# Patient Record
Sex: Male | Born: 1965 | Race: White | Hispanic: No | Marital: Married | Smoking: Never smoker
Health system: Southern US, Community
[De-identification: ages and names within clinical notes are randomized; demographics above are authoritative.]

## PROBLEM LIST (undated history)

## (undated) DIAGNOSIS — K219 Gastro-esophageal reflux disease without esophagitis: Secondary | ICD-10-CM

## (undated) DIAGNOSIS — R519 Headache, unspecified: Secondary | ICD-10-CM

## (undated) DIAGNOSIS — N390 Urinary tract infection, site not specified: Secondary | ICD-10-CM

## (undated) DIAGNOSIS — M5412 Radiculopathy, cervical region: Secondary | ICD-10-CM

## (undated) DIAGNOSIS — J309 Allergic rhinitis, unspecified: Secondary | ICD-10-CM

## (undated) DIAGNOSIS — R51 Headache: Secondary | ICD-10-CM

## (undated) DIAGNOSIS — G4730 Sleep apnea, unspecified: Secondary | ICD-10-CM

## (undated) DIAGNOSIS — F411 Generalized anxiety disorder: Secondary | ICD-10-CM

## (undated) DIAGNOSIS — R569 Unspecified convulsions: Secondary | ICD-10-CM

## (undated) DIAGNOSIS — K921 Melena: Secondary | ICD-10-CM

## (undated) DIAGNOSIS — S8392XA Sprain of unspecified site of left knee, initial encounter: Secondary | ICD-10-CM

## (undated) DIAGNOSIS — B019 Varicella without complication: Secondary | ICD-10-CM

## (undated) DIAGNOSIS — K649 Unspecified hemorrhoids: Secondary | ICD-10-CM

## (undated) DIAGNOSIS — I209 Angina pectoris, unspecified: Secondary | ICD-10-CM

## (undated) DIAGNOSIS — I10 Essential (primary) hypertension: Secondary | ICD-10-CM

## (undated) HISTORY — DX: Varicella without complication: B01.9

## (undated) HISTORY — DX: Unspecified hemorrhoids: K64.9

## (undated) HISTORY — DX: Essential (primary) hypertension: I10

## (undated) HISTORY — PX: RECONSTRUCTION OF NOSE: SHX2301

## (undated) HISTORY — DX: Melena: K92.1

## (undated) HISTORY — DX: Sprain of unspecified site of left knee, initial encounter: S83.92XA

## (undated) HISTORY — DX: Headache: R51

## (undated) HISTORY — DX: Urinary tract infection, site not specified: N39.0

## (undated) HISTORY — DX: Headache, unspecified: R51.9

## (undated) HISTORY — DX: Allergic rhinitis, unspecified: J30.9

## (undated) HISTORY — DX: Unspecified convulsions: R56.9

## (undated) HISTORY — DX: Gastro-esophageal reflux disease without esophagitis: K21.9

## (undated) HISTORY — PX: OTHER SURGICAL HISTORY: SHX169

---

## 1996-10-17 DIAGNOSIS — R569 Unspecified convulsions: Secondary | ICD-10-CM

## 1996-10-17 HISTORY — DX: Unspecified convulsions: R56.9

## 1998-10-06 ENCOUNTER — Ambulatory Visit (HOSPITAL_BASED_OUTPATIENT_CLINIC_OR_DEPARTMENT_OTHER): Admission: RE | Admit: 1998-10-06 | Discharge: 1998-10-06 | Payer: Self-pay | Admitting: Orthopaedic Surgery

## 1998-10-17 HISTORY — PX: SHOULDER SURGERY: SHX246

## 2009-04-17 ENCOUNTER — Emergency Department (HOSPITAL_COMMUNITY): Admission: EM | Admit: 2009-04-17 | Discharge: 2009-04-17 | Payer: Self-pay | Admitting: Family Medicine

## 2011-01-24 LAB — GC/CHLAMYDIA PROBE AMP, GENITAL
Chlamydia, DNA Probe: NEGATIVE
GC Probe Amp, Genital: NEGATIVE

## 2011-01-24 LAB — POCT URINALYSIS DIP (DEVICE)
Glucose, UA: NEGATIVE mg/dL
Hgb urine dipstick: NEGATIVE
Ketones, ur: NEGATIVE mg/dL
Specific Gravity, Urine: 1.02 (ref 1.005–1.030)
Urobilinogen, UA: 0.2 mg/dL (ref 0.0–1.0)

## 2012-03-15 ENCOUNTER — Encounter (HOSPITAL_COMMUNITY): Payer: Self-pay | Admitting: *Deleted

## 2012-03-15 ENCOUNTER — Emergency Department (INDEPENDENT_AMBULATORY_CARE_PROVIDER_SITE_OTHER)
Admission: EM | Admit: 2012-03-15 | Discharge: 2012-03-15 | Disposition: A | Discharge status: Home / Self Care | Payer: PRIVATE HEALTH INSURANCE | Source: Home / Self Care | Attending: Emergency Medicine | Admitting: Emergency Medicine

## 2012-03-15 DIAGNOSIS — R42 Dizziness and giddiness: Secondary | ICD-10-CM

## 2012-03-15 LAB — POCT I-STAT, CHEM 8
BUN: 16 mg/dL (ref 6–23)
Calcium, Ion: 1.21 mmol/L (ref 1.12–1.32)
Chloride: 101 mEq/L (ref 96–112)
HCT: 50 % (ref 39.0–52.0)
Potassium: 4.1 mEq/L (ref 3.5–5.1)

## 2012-03-15 NOTE — ED Notes (Signed)
Pt  Reports  Symptoms  Of  dizzyness    Which  began around 1100  Am      He   denys  Any pain  denys  Any  Nausea /  Vomiting      -  He  Ambulated  To  Exam room  With a  Slow  Steady  Fluid  Gait     -  He  denys  Any   Injury  Or any  Known  Causative  Agent       He  Is  Sitting upright  On  Exam table  Speaking in  Complete  sentances

## 2012-03-15 NOTE — Discharge Instructions (Signed)
Suspect your  dizziness and headaches might be related to the medicine you're taking have encouraged you and recommended you to talk to your neurologist about this. Have also discussed what symptoms would warrant further evaluation in the emergency department.    Dizziness Dizziness is a common problem. It is a feeling of unsteadiness or lightheadedness. You may feel like you are about to faint. Dizziness can lead to injury if you stumble or fall. A person of any age group can suffer from dizziness, but dizziness is more common in older adults. CAUSES  Dizziness can be caused by many different things, including:  Middle ear problems.   Standing for too long.   Infections.   An allergic reaction.   Aging.   An emotional response to something, such as the sight of blood.   Side effects of medicines.   Fatigue.   Problems with circulation or blood pressure.   Excess use of alcohol, medicines, or illegal drug use.   Breathing too fast (hyperventilation).   An arrhythmia or problems with your heart rhythm.   Low red blood cell count (anemia).   Pregnancy.   Vomiting, diarrhea, fever, or other illnesses that cause dehydration.   Diseases or conditions such as Parkinson's disease, high blood pressure (hypertension), diabetes, and thyroid problems.   Exposure to extreme heat.  DIAGNOSIS  To find the cause of your dizziness, your caregiver may do a physical exam, lab tests, radiologic imaging scans, or an electrocardiography test (ECG).  TREATMENT  Treatment of dizziness depends on the cause of your symptoms and can vary greatly. HOME CARE INSTRUCTIONS   Drink enough fluids to keep your urine clear or pale yellow. This is especially important in very hot weather. In the elderly, it is also important in cold weather.   If your dizziness is caused by medicines, take them exactly as directed. When taking blood pressure medicines, it is especially important to get up slowly.    Rise slowly from chairs and steady yourself until you feel okay.   In the morning, first sit up on the side of the bed. When this seems okay, stand slowly while holding onto something until you know your balance is fine.   If you need to stand in one place for a long time, be sure to move your legs often. Tighten and relax the muscles in your legs while standing.   If dizziness continues to be a problem, have someone stay with you for a day or two. Do this until you feel you are well enough to stay alone. Have the person call your caregiver if he or she notices changes in you that are concerning.   Do not drive or use heavy machinery if you feel dizzy.  SEEK IMMEDIATE MEDICAL CARE IF:   Your dizziness or lightheadedness gets worse.   You feel nauseous or vomit.   You develop problems with talking, walking, weakness, or using your arms, hands, or legs.   You are not thinking clearly or you have difficulty forming sentences. It may take a friend or family member to determine if your thinking is normal.   You develop chest pain, abdominal pain, shortness of breath, or sweating.   Your vision changes.   You notice any bleeding.   You have side effects from medicine that seems to be getting worse rather than better.  MAKE SURE YOU:   Understand these instructions.   Will watch your condition.   Will get help right away if you  are not doing well or get worse.  Document Released: 03/29/2001 Document Revised: 09/22/2011 Document Reviewed: 04/22/2011 [facility] Patient Information 2012 [place], [place].

## 2012-03-15 NOTE — ED Provider Notes (Signed)
History     CSN: [id number]  Arrival date & time 03/15/12  1609   First MD Initiated Contact with Patient 03/15/12 1614      Chief Complaint  Patient presents with  . Dizziness    (Consider location/radiation/quality/duration/timing/severity/associated sxs/prior treatment) HPI Comments: Patient presents urgent care today with some vague symptomatology that describes as dizziness and his head. Patient described that he hasn't felt symptoms like this before but have faded away and has been told by another doctor that he should drink at least 2-3 day or as per day when he gets to feel like this. In from some abnormal labs that he has had in the past. (Patient does not elaborate further on the details of this abnormal labs). Patient denies any specific symptoms such as, numbness, weakness visual disturbances.   She denies any shortness of breath, shortness of breath palpitations or any further symptoms.    The history is provided by the patient.    History reviewed. No pertinent past medical history.  Past Surgical History  Procedure Date  . Shoulder surgery   . Seizures     No family history on file.  History  Substance Use Topics  . Smoking status: Not on file  . Smokeless tobacco: Not on file  . Alcohol Use:       Review of Systems  Constitutional: Negative for fever, chills, activity change and appetite change.  Musculoskeletal: Negative for myalgias and arthralgias.  Neurological: Positive for dizziness and headaches. Negative for tremors, seizures, syncope, speech difficulty, weakness, light-headedness and numbness.  Psychiatric/Behavioral: Positive for decreased concentration. Negative for suicidal ideas, behavioral problems, confusion, sleep disturbance, self-injury and agitation. The patient is not nervous/anxious and is not hyperactive.     Allergies  Review of patient's allergies indicates not on file.  Home Medications   Current Outpatient Rx  Name  Route Sig Dispense Refill  . OXCARBAZEPINE 300 MG PO TABS Oral Take 300 mg by mouth 2 (two) times daily.      BP 133/74  Pulse 64  Temp(Src) 98.3 F (36.8 C) (Oral)  Resp 16  SpO2 100%  Physical Exam  Nursing note and vitals reviewed. Constitutional: He is oriented to person, place, and time. He appears well-developed and well-nourished.  Eyes: Conjunctivae are normal. Pupils are equal, round, and reactive to light.  Neck: Neck supple. No JVD present.  Cardiovascular: Normal rate, normal heart sounds and intact distal pulses.  Exam reveals no gallop and no friction rub.   No murmur heard. Pulmonary/Chest: Effort normal and breath sounds normal.  Musculoskeletal: Normal range of motion. He exhibits no edema and no tenderness.  Lymphadenopathy:    He has no cervical adenopathy.  Neurological: He is oriented to person, place, and time. He has normal strength. He is not disoriented. He displays no tremor. No cranial nerve deficit or sensory deficit. He exhibits normal muscle tone. He displays a negative Romberg sign. Coordination and gait normal.  Skin: Skin is warm.    ED Course  Procedures (including critical care time)   Labs Reviewed  POCT I-STAT, CHEM 8   No results found.   1. Dizziness       MDM  Discussed with patient that most likely his symptomatology could be related to his Trileptal as dizziness and headaches are common side effects. I have apprised him to discuss this further with his neurologist. Patient agree with treatment plan and understood that his exam was normal and we discuss fogginess or dizziness sensation  this appears patient of knowledge normal lab results today. Patient's cardiovascular neurological exam were normal. He was also advised to be driven by a family member or friend, patient understood and agreed with plan      Jimmie Molly, MD 03/15/12 2046

## 2012-03-15 NOTE — ED Notes (Signed)
bp  116/70   Pulse  65    Flat         bp  133/84  Pulse  64  Sitting      bp   138/90  Pulse  78   standing

## 2013-07-02 ENCOUNTER — Emergency Department (HOSPITAL_COMMUNITY)
Admission: EM | Admit: 2013-07-02 | Discharge: 2013-07-02 | Disposition: A | Discharge status: Home / Self Care | Payer: Managed Care, Other (non HMO) | Source: Home / Self Care | Attending: Emergency Medicine | Admitting: Emergency Medicine

## 2013-07-02 ENCOUNTER — Encounter (HOSPITAL_COMMUNITY): Payer: Self-pay | Admitting: *Deleted

## 2013-07-02 DIAGNOSIS — K645 Perianal venous thrombosis: Secondary | ICD-10-CM

## 2013-07-02 NOTE — ED Notes (Signed)
Pt  Reports  Bright   Red  Rectal  Bleeding  Which  He  Reports  He  Noticed        This  Am     He  Reports  He  Noticed  A  Bulge  In his  Rectal  Area  sev  Days  Ago   He  denys  Any  Constipation     He  denys  Any pain  No  History of  gerd              He        Reports  Some  Nausea  No      But  No  Vomiting

## 2013-07-02 NOTE — ED Provider Notes (Signed)
Chief Complaint:   Chief Complaint  Patient presents with  . Rectal Bleeding    History of Present Illness:    Benjamin Andrews is a 47 year old male who noted a sore bulge just to the left of the anus 4 days ago. Today it began to bleed and he's had trouble getting it to stop. The blood is bright red. It is somewhat sore to touch. He's felt nauseated and had some sweats. He denies abdominal pain, vomiting, diarrhea, or constipation.  Review of Systems:  Other than noted above, the patient denies any of the following symptoms: Constitutional:  No fever, chills, fatigue, weight loss or anorexia. Lungs:  No cough or shortness of breath. Heart:  No chest pain, palpitations, syncope or edema.  No cardiac history. Abdomen:  No nausea, vomiting, hematememesis, melena, constipation, or diarrhea. GU:  No dysuria, frequency, urgency, or hematuria.   Skin:  No rash or itching.  PMFSH:  Past medical history, family history, social history, meds, and allergies were reviewed along with nurse's notes.  No prior abdominal surgeries or history of GI problems.  No use of NSAIDs or aspirin.  No excessive  alcohol intake.   Physical Exam:   Vital signs:  BP 149/84  Pulse 63  Temp(Src) 98.3 F (36.8 C) (Oral)  Resp 16  SpO2 99% Gen:  Alert, oriented, in no distress. Lungs:  Breath sounds clear and equal bilaterally.  No wheezes, rales or rhonchi. Heart:  Regular rhythm.  No gallops or murmers.   Abdomen:  Soft, nontender, no organomegaly or mass, bowel sounds were normal. Skin:  Clear, warm and dry.  No rash.  Procedure Note:  Verbal informed consent was obtained from the patient.  Risks and benefits were outlined with the patient.  Patient understands and accepts these risks.  Identity of the patient was confirmed verbally and by armband.    Procedure was performed as followed:  External exam of the anus reveals a thrombosed external hemorrhoid which had partially opened up and was bleeding profusely. The  clot was evacuated, pressure was used first to stop the bleeding, but was unsuccessful. Thereafter the hemorrhoid was anesthetized with 2 mL of 2% Xylocaine with epinephrine. The small opening was cauterized with silver nitrate, but still the bleeding persisted. Finally the hemorrhoid was closed with a figure 8 3-0 catgut suture. Thereafter the bleeding stopped completely. Some gauze was left in place. He was instructed to do daily sitz baths with Epsom salts, apply operation age, and take a stool softener. If the bleeding persists he will need to see a surgeon and he was given the name of Dr. Avel Peace. In any event, he will need a colonoscopy and was given the name of Dr. Yancey Flemings.  Patient tolerated the procedure well without any immediate complications.  Assessment:  The encounter diagnosis was Thrombosed external hemorrhoid.  Thrombosed external hemorrhoid with bleeding. Only followup with gastroenterology. If bleeding or pain persists and need to have it excised by surgeon.  Plan:   1.  The following meds were prescribed:   Discharge Medication List as of 07/02/2013  2:40 PM     2.  The patient was instructed in symptomatic care and handouts were given. 3.  The patient was told to return if becoming worse in any way, if no better in 3 or 4 days, and given some red flag symptoms such as persistent bleeding or pain that would indicate earlier return. 4.  Follow up with Dr. Avel Peace if pain  or bleeding persisted, and with Dr. Yancey Flemings for colonoscopy.     Reuben Likes, MD 07/02/13 [phone]

## 2015-09-30 ENCOUNTER — Encounter: Payer: Self-pay | Admitting: Family Medicine

## 2015-09-30 ENCOUNTER — Ambulatory Visit (INDEPENDENT_AMBULATORY_CARE_PROVIDER_SITE_OTHER): Payer: Managed Care, Other (non HMO) | Admitting: Family Medicine

## 2015-09-30 VITALS — BP 126/86 | HR 72 | Temp 98.6°F | Ht 75.0 in | Wt 246.0 lb

## 2015-09-30 DIAGNOSIS — Z13220 Encounter for screening for lipoid disorders: Secondary | ICD-10-CM | POA: Diagnosis not present

## 2015-09-30 DIAGNOSIS — F411 Generalized anxiety disorder: Secondary | ICD-10-CM | POA: Insufficient documentation

## 2015-09-30 DIAGNOSIS — Z1211 Encounter for screening for malignant neoplasm of colon: Secondary | ICD-10-CM | POA: Diagnosis not present

## 2015-09-30 DIAGNOSIS — E669 Obesity, unspecified: Secondary | ICD-10-CM

## 2015-09-30 DIAGNOSIS — Z0000 Encounter for general adult medical examination without abnormal findings: Secondary | ICD-10-CM | POA: Insufficient documentation

## 2015-09-30 DIAGNOSIS — Z130 Encounter for screening for diseases of the blood and blood-forming organs and certain disorders involving the immune mechanism: Secondary | ICD-10-CM

## 2015-09-30 DIAGNOSIS — Z0001 Encounter for general adult medical examination with abnormal findings: Secondary | ICD-10-CM | POA: Insufficient documentation

## 2015-09-30 MED ORDER — BUSPIRONE HCL 7.5 MG PO TABS: 7.5000 mg | ORAL_TABLET | Freq: Two times a day (BID) | ORAL | Status: DC

## 2015-09-30 NOTE — Patient Instructions (Signed)
Nice to meet you. I have refilled your BuSpar. Please schedule a lab appointment for fasting labs some morning next week. We will get her set up for colonoscopy.

## 2015-09-30 NOTE — Progress Notes (Signed)
Patient ID: Benjamin Andrews, male   DOB: [birth year], 49 y.o.   MRN: [id number]  Benjamin Rumps, MD Phone: [phone]  Benjamin Andrews is a 49 y.o. male who presents today for new patient visit.  Presents as a new patient. Here for yearly physical and for refill of BuSpar  Patient reports he is in good health. No recent issues with blood pressure. No history of cholesterol issues. No history of diabetes. He used to dip, though he quit this around the year 2000. Drinks alcohol infrequently. No illicit drug use. He has not had a TD In the last 10 years. He has never gotten a flu shot. He declines flu shot today.  He's not had a colonoscopy.  He does not exercise at this time. He has been eating healthier. He has a friend and is fixing her meals. These are home-cooked meals. He eats veggies with each meal. Denies eating fatty fried foods. Does not eat fast food. Drink sweet tea to 3 times a week.  Anxiety: Patient notes he diagnosed with this one and a half years ago. He had been trying to cope with decreasing tobacco use and quitting. He had been using diet. He had quite a bit of anxiety with this has always had a baseline anxiety. His prior physician placed him on BuSpar. He been on BuSpar 15 mg twice a day, though this made him drowsy. The decrease in 7.5 mg twice a day and he has tolerated this well. He describes his anxiety as butterflies and nervousness when he has lost on his mind. He denies depression. He does note issues obtaining an erection we'll try to have sex. He notes he is able to get full nocturnal erections. He has been on sildenafil for this and it is modestly beneficial. Patient additionally notes a history of nocturnal seizures. His last seizure was in 1999. He's been on seizure medicines since that time. He is currently on Trileptal. He is followed by neurology for this. Sees him every 6 months.    Active Ambulatory Problems    Diagnosis Date Noted  . Generalized anxiety  disorder 09/30/2015  . Annual physical exam 09/30/2015   Resolved Ambulatory Problems    Diagnosis Date Noted  . No Resolved Ambulatory Problems   Past Medical History  Diagnosis Date  . Hemorrhoids   . Blood in stool   . Chickenpox   . Headache   . GERD (gastroesophageal reflux disease)   . Allergic rhinitis   . High blood pressure   . Seizures ([place])   . UTI (lower urinary tract infection)     Family History  Problem Relation Age of Onset  . Alcoholism      Grandparent  . Arthritis      Grandparent  . Stroke      Parent  . Diabetes      Grandparent  . Rectal cancer Maternal Uncle     Social History   Social History  . Marital Status: Legally Separated    Spouse Name: N/A  . Number of Children: N/A  . Years of Education: N/A   Occupational History  . Not on file.   Social History Main Topics  . Smoking status: Never Smoker   . Smokeless tobacco: Former Systems developer  . Alcohol Use: 0.0 oz/week    0 Standard drinks or equivalent per week     Comment: Infrequently  . Drug Use: No  . Sexual Activity: Not on file   Other Topics Concern  . Not  on file   Social History Narrative    ROS   General:  Negative for unexplained weight loss, fever Skin: Negative for new or changing mole, sore that won't heal HEENT: Negative for trouble hearing, trouble seeing, ringing in ears, mouth sores, hoarseness, change in voice, dysphagia. CV:  Negative for chest pain, dyspnea, edema, palpitations Resp: Negative for cough, dyspnea, hemoptysis GI: Negative for nausea, vomiting, diarrhea, constipation, abdominal pain, melena, hematochezia. GU: Positive for sexual dysfunction, Negative for dysuria, incontinence, urinary hesitance, hematuria, vaginal or penile discharge, polyuria, lumps in testicle or breasts MSK: Negative for muscle cramps or aches, joint pain or swelling Neuro: Negative for headaches, weakness, numbness, dizziness, passing out/fainting Psych: Positive for anxiety,  Negative for depression, memory problems  Objective  Physical Exam Filed Vitals:   09/30/15 1527  BP: 126/86  Pulse: 72  Temp: 98.6 F (37 C)    Physical Exam  Constitutional: He is well-developed, well-nourished, and in no distress.  HENT:  Head: Normocephalic and atraumatic.  Right Ear: External ear normal.  Left Ear: External ear normal.  Mouth/Throat: Oropharynx is clear and moist. No oropharyngeal exudate.  Eyes: Conjunctivae are normal. Pupils are equal, round, and reactive to light.  Neck: Neck supple.  Cardiovascular: Normal rate, regular rhythm and normal heart sounds.  Exam reveals no gallop and no friction rub.   No murmur heard. Pulmonary/Chest: Effort normal and breath sounds normal. No respiratory distress. He has no wheezes. He has no rales.  Abdominal: Soft. Bowel sounds are normal. He exhibits no distension. There is no tenderness. There is no rebound and no guarding.  Genitourinary: Rectum normal and prostate normal.  Musculoskeletal: He exhibits no edema.  Lymphadenopathy:    He has no cervical adenopathy.  Neurological: He is alert. Gait normal.  Skin: Skin is warm and dry. He is not diaphoretic.  Psychiatric:  Mood anxious, affect normal     Assessment/Plan:   Generalized anxiety disorder Patient with long history of anxiety issues. Has been stable on BuSpar. GAD 7 score of 6 with somewhat difficult. PHQ 9 score of 7 with somewhat difficult. Discussed that his sexual dysfunction may be related to anxiety given that he is able to obtain nocturnal erections. Discussed that we will check screening lab work as part of his physical to determine if there is an organic cause and if there is not have to work on treating his anxiety and a better manner.  Annual physical exam Patient is overall doing well, chronic medical issues appear well controlled at this time. Blood pressures in the normal range. Patient is obese and discussed diet and exercise. patient has  made good strides with his diet. No longer uses tobacco. Minimal alcohol use. Sees a dentist. We will refer him for colonoscopy. He deferred TD Today. Declined flu shot. We'll check screening CMP, lipid panel, CBC, TSH, and A1c.   patient will continue follow-up with his neurologist for his history of seizures.  Orders Placed This Encounter  Procedures  . Lipid Profile    Standing Status: Future     Number of Occurrences:      Standing Expiration Date: 09/29/2016  . Comp Met (CMET)    Standing Status: Future     Number of Occurrences:      Standing Expiration Date: 09/29/2016  . HgB A1c    Standing Status: Future     Number of Occurrences:      Standing Expiration Date: 09/29/2016  . CBC    Standing Status: Future  Number of Occurrences:      Standing Expiration Date: 09/29/2016  . TSH    Standing Status: Future     Number of Occurrences:      Standing Expiration Date: 09/29/2016  . Ambulatory referral to Gastroenterology    Referral Priority:  Routine    Referral Type:  Consultation    Referral Reason:  Specialty Services Required    Number of Visits Requested:  1    Dragon voice recognition software was used during the dictation process of this note. If any phrases or words seem inappropriate it is likely secondary to the translation process being inefficient.  Benjamin Andrews

## 2015-09-30 NOTE — Assessment & Plan Note (Signed)
Patient with long history of anxiety issues. Has been stable on BuSpar. GAD 7 score of 6 with somewhat difficult. PHQ 9 score of 7 with somewhat difficult. Discussed that his sexual dysfunction may be related to anxiety given that he is able to obtain nocturnal erections. Discussed that we will check screening lab work as part of his physical to determine if there is an organic cause and if there is not have to work on treating his anxiety and a better manner.

## 2015-09-30 NOTE — Progress Notes (Signed)
Pre visit review using our clinic review tool, if applicable. No additional management support is needed unless otherwise documented below in the visit note. 

## 2015-09-30 NOTE — Assessment & Plan Note (Addendum)
Patient is overall doing well, chronic medical issues appear well controlled at this time. Blood pressures in the normal range. Patient is obese and discussed diet and exercise. patient has made good strides with his diet. No longer uses tobacco. Minimal alcohol use. Sees a dentist. We will refer him for colonoscopy. He deferred TD Today. Declined flu shot. We'll check screening CMP, lipid panel, CBC, TSH, and A1c.

## 2015-10-06 ENCOUNTER — Other Ambulatory Visit: Payer: Self-pay | Admitting: Surgical

## 2015-10-06 MED ORDER — BUSPIRONE HCL 7.5 MG PO TABS: 7.5000 mg | ORAL_TABLET | Freq: Two times a day (BID) | ORAL | Status: DC

## 2015-10-07 ENCOUNTER — Other Ambulatory Visit (INDEPENDENT_AMBULATORY_CARE_PROVIDER_SITE_OTHER): Payer: Managed Care, Other (non HMO)

## 2015-10-07 DIAGNOSIS — Z130 Encounter for screening for diseases of the blood and blood-forming organs and certain disorders involving the immune mechanism: Secondary | ICD-10-CM

## 2015-10-07 DIAGNOSIS — E669 Obesity, unspecified: Secondary | ICD-10-CM

## 2015-10-07 DIAGNOSIS — Z13220 Encounter for screening for lipoid disorders: Secondary | ICD-10-CM | POA: Diagnosis not present

## 2015-10-07 LAB — COMPREHENSIVE METABOLIC PANEL
ALBUMIN: 4.1 g/dL (ref 3.5–5.2)
ALK PHOS: 69 U/L (ref 39–117)
ALT: 25 U/L (ref 0–53)
AST: 24 U/L (ref 0–37)
BUN: 12 mg/dL (ref 6–23)
CALCIUM: 9.4 mg/dL (ref 8.4–10.5)
CO2: 27 mEq/L (ref 19–32)
Chloride: 105 mEq/L (ref 96–112)
Creatinine, Ser: 0.95 mg/dL (ref 0.40–1.50)
GFR: 89.16 mL/min (ref >60.00)
Glucose, Bld: 99 mg/dL (ref 70–99)
POTASSIUM: 4.4 meq/L (ref 3.5–5.1)
SODIUM: 140 meq/L (ref 135–145)
TOTAL PROTEIN: 6.9 g/dL (ref 6.0–8.3)
Total Bilirubin: 0.6 mg/dL (ref 0.2–1.2)

## 2015-10-07 LAB — LIPID PANEL
CHOLESTEROL: 179 mg/dL (ref 0–200)
HDL: 33 mg/dL — AB (ref >39.00)
LDL CALC: 129 mg/dL — AB (ref 0–99)
NonHDL: 145.53
TRIGLYCERIDES: 82 mg/dL (ref 0.0–149.0)
Total CHOL/HDL Ratio: 5
VLDL: 16.4 mg/dL (ref 0.0–40.0)

## 2015-10-07 LAB — TSH: TSH: 1.34 u[IU]/mL (ref 0.35–4.50)

## 2015-10-07 LAB — CBC
HCT: 49.7 % (ref 39.0–52.0)
Hemoglobin: 16.5 g/dL (ref 13.0–17.0)
MCHC: 33.2 g/dL (ref 30.0–36.0)
MCV: 89.6 fl (ref 78.0–100.0)
PLATELETS: 165 10*3/uL (ref 150.0–400.0)
RBC: 5.55 Mil/uL (ref 4.22–5.81)
RDW: 13.7 % (ref 11.5–15.5)
WBC: 8.6 10*3/uL (ref 4.0–10.5)

## 2015-10-07 LAB — HEMOGLOBIN A1C: HEMOGLOBIN A1C: 5.5 % (ref 4.6–6.5)

## 2015-10-09 ENCOUNTER — Telehealth: Payer: Self-pay

## 2015-10-09 NOTE — Telephone Encounter (Signed)
Attempted to call patient twice by number left on message and phone number of record.  Message is that phone is not in service

## 2015-12-01 ENCOUNTER — Other Ambulatory Visit: Payer: Self-pay | Admitting: Family Medicine

## 2015-12-01 NOTE — Telephone Encounter (Signed)
Filled in December of 2016 with 3 refills. Please advise?

## 2015-12-02 NOTE — Telephone Encounter (Signed)
Refill given. Patient needs a follow-up for his anxiety. Thanks.

## 2015-12-02 NOTE — Telephone Encounter (Signed)
Sent a MyChart message due to mobile not active.

## 2015-12-04 ENCOUNTER — Encounter
Admission: RE | Disposition: A | Discharge status: Home / Self Care | Payer: Self-pay | Source: Ambulatory Visit | Attending: Unknown Physician Specialty

## 2015-12-04 ENCOUNTER — Ambulatory Visit: Payer: Managed Care, Other (non HMO) | Admitting: Anesthesiology

## 2015-12-04 ENCOUNTER — Ambulatory Visit
Admission: RE | Admit: 2015-12-04 | Discharge: 2015-12-04 | Disposition: A | Discharge status: Home / Self Care | Payer: Managed Care, Other (non HMO) | Source: Ambulatory Visit | Attending: Unknown Physician Specialty | Admitting: Unknown Physician Specialty

## 2015-12-04 ENCOUNTER — Encounter: Payer: Self-pay | Admitting: *Deleted

## 2015-12-04 DIAGNOSIS — G4730 Sleep apnea, unspecified: Secondary | ICD-10-CM | POA: Insufficient documentation

## 2015-12-04 DIAGNOSIS — Z87891 Personal history of nicotine dependence: Secondary | ICD-10-CM | POA: Insufficient documentation

## 2015-12-04 DIAGNOSIS — Z1211 Encounter for screening for malignant neoplasm of colon: Secondary | ICD-10-CM | POA: Diagnosis not present

## 2015-12-04 DIAGNOSIS — K219 Gastro-esophageal reflux disease without esophagitis: Secondary | ICD-10-CM | POA: Insufficient documentation

## 2015-12-04 DIAGNOSIS — D122 Benign neoplasm of ascending colon: Secondary | ICD-10-CM | POA: Diagnosis not present

## 2015-12-04 DIAGNOSIS — I10 Essential (primary) hypertension: Secondary | ICD-10-CM | POA: Diagnosis not present

## 2015-12-04 DIAGNOSIS — K640 First degree hemorrhoids: Secondary | ICD-10-CM | POA: Insufficient documentation

## 2015-12-04 DIAGNOSIS — Z79899 Other long term (current) drug therapy: Secondary | ICD-10-CM | POA: Diagnosis not present

## 2015-12-04 DIAGNOSIS — D123 Benign neoplasm of transverse colon: Secondary | ICD-10-CM | POA: Insufficient documentation

## 2015-12-04 HISTORY — DX: Sleep apnea, unspecified: G47.30

## 2015-12-04 HISTORY — PX: COLONOSCOPY WITH PROPOFOL: SHX5780

## 2015-12-04 LAB — HM COLONOSCOPY

## 2015-12-04 SURGERY — COLONOSCOPY WITH PROPOFOL
Anesthesia: General

## 2015-12-04 MED ORDER — FENTANYL CITRATE (PF) 100 MCG/2ML IJ SOLN
INTRAMUSCULAR | Status: DC | PRN
  Administered 2015-12-04: 100 ug via INTRAVENOUS

## 2015-12-04 MED ORDER — MIDAZOLAM HCL 2 MG/2ML IJ SOLN
INTRAMUSCULAR | Status: DC | PRN
  Administered 2015-12-04: 2 mg via INTRAVENOUS

## 2015-12-04 MED ORDER — ONDANSETRON HCL 4 MG/2ML IJ SOLN
INTRAMUSCULAR | Status: DC | PRN
  Administered 2015-12-04: 4 mg via INTRAVENOUS

## 2015-12-04 MED ORDER — SODIUM CHLORIDE 0.9 % IV SOLN
INTRAVENOUS | Status: DC
  Administered 2015-12-04: 1000 mL via INTRAVENOUS

## 2015-12-04 MED ORDER — PROPOFOL 10 MG/ML IV BOLUS
INTRAVENOUS | Status: DC | PRN
  Administered 2015-12-04: 100 mg via INTRAVENOUS

## 2015-12-04 MED ORDER — PROPOFOL 500 MG/50ML IV EMUL
INTRAVENOUS | Status: DC | PRN
  Administered 2015-12-04: 100 ug/kg/min via INTRAVENOUS

## 2015-12-04 MED ORDER — SODIUM CHLORIDE 0.9 % IV SOLN
INTRAVENOUS | Status: DC
Start: 2015-12-04 — End: 2015-12-04

## 2015-12-04 NOTE — H&P (Signed)
   Primary Care Physician:  [name], MD Primary Gastroenterologist:  Dr. Vira Agar  Pre-Procedure History & Physical: HPI:  Benjamin Andrews is a 50 y.o. male is here for an colonoscopy.   Past Medical History  Diagnosis Date  . Hemorrhoids   . Blood in stool   . Chickenpox   . Headache   . Allergic rhinitis   . High blood pressure   . Seizures ([place])   . UTI (lower urinary tract infection)   . Sleep apnea   . GERD (gastroesophageal reflux disease)     Past Surgical History  Procedure Laterality Date  . Shoulder surgery    . Broken nose      Prior to Admission medications   Medication Sig Start Date End Date Taking? Authorizing Provider  Biotin (BIOTIN 5000) 5 MG CAPS Take by mouth.    Historical Provider, MD  busPIRone (BUSPAR) 7.5 MG tablet TAKE 1 TABLET BY MOUTH TWICE DAILY 12/02/15   Leone Haven, MD  Multiple Vitamin (MULTIVITAMIN) tablet Take 1 tablet by mouth daily.    Historical Provider, MD  Omega-3 Fatty Acids (FISH OIL CONCENTRATE PO) Take by mouth.    Historical Provider, MD  Oxcarbazepine (TRILEPTAL) 300 MG tablet Take 300 mg by mouth 2 (two) times daily.    Historical Provider, MD  sildenafil (REVATIO) 20 MG tablet Take 20 mg by mouth 3 (three) times daily.    Historical Provider, MD    Allergies as of 11/09/2015  . (No Known Allergies)    Family History  Problem Relation Age of Onset  . Alcoholism      Grandparent  . Arthritis      Grandparent  . Stroke      Parent  . Diabetes      Grandparent  . Rectal cancer Maternal Uncle     Social History   Social History  . Marital Status: Legally Separated    Spouse Name: N/A  . Number of Children: N/A  . Years of Education: N/A   Occupational History  . Not on file.   Social History Main Topics  . Smoking status: Never Smoker   . Smokeless tobacco: Former Systems developer  . Alcohol Use: 0.0 oz/week    0 Standard drinks or equivalent per week     Comment: Infrequently  . Drug Use: No  . Sexual  Activity: Not on file   Other Topics Concern  . Not on file   Social History Narrative    Review of Systems: See HPI, otherwise negative ROS  Physical Exam: BP 137/90 mmHg  Pulse 54  Temp(Src) 98.3 F (36.8 C) (Tympanic)  Resp 18  Ht 6' 3.5" (1.918 m)  Wt 108.863 kg (240 lb)  BMI 29.59 kg/m2  SpO2 98% General:   Alert,  pleasant and cooperative in NAD Head:  Normocephalic and atraumatic. Neck:  Supple; no masses or thyromegaly. Lungs:  Clear throughout to auscultation.    Heart:  Regular rate and rhythm. Abdomen:  Soft, nontender and nondistended. Normal bowel sounds, without guarding, and without rebound.   Neurologic:  Alert and  oriented x4;  grossly normal neurologically.  Impression/Plan: Kareen Gair is here for an colonoscopy to be performed for screening colonoscopy  Risks, benefits, limitations, and alternatives regarding  colonoscopy have been reviewed with the patient.  Questions have been answered.  All parties agreeable.   Gaylyn Cheers, MD  12/04/2015, 10:57 AM

## 2015-12-04 NOTE — Transfer of Care (Signed)
Immediate Anesthesia Transfer of Care Note  Patient: Benjamin Andrews  Procedure(s) Performed: Procedure(s): COLONOSCOPY WITH PROPOFOL (N/A)  Patient Location: PACU  Anesthesia Type:General  Level of Consciousness: sedated  Airway & Oxygen Therapy: Patient connected to nasal cannula oxygen  Post-op Assessment: Report given to RN  Post vital signs: Reviewed and stable  Last Vitals:  Filed Vitals:   12/04/15 1018 12/04/15 1131  BP: 137/90   Pulse: 54 68  Temp: 36.8 C   Resp: 18 14    Complications: No apparent anesthesia complications

## 2015-12-04 NOTE — Anesthesia Preprocedure Evaluation (Signed)
Anesthesia Evaluation  Patient identified by MRN, date of birth, ID band Patient awake    Reviewed: Allergy & Precautions, NPO status , Patient's Chart, lab work & pertinent test results  Airway Mallampati: II       Dental no notable dental hx.    Pulmonary sleep apnea , former smoker,    Pulmonary exam normal        Cardiovascular Exercise Tolerance: Good hypertension,  Rhythm:Regular Rate:Normal     Neuro/Psych Seizures -,     GI/Hepatic Neg liver ROS, GERD  ,  Endo/Other  negative endocrine ROS  Renal/GU negative Renal ROS     Musculoskeletal   Abdominal Normal abdominal exam  (+)   Peds  Hematology negative hematology ROS (+)   Anesthesia Other Findings   Reproductive/Obstetrics                             Anesthesia Physical Anesthesia Plan  ASA: II  Anesthesia Plan: General   Post-op Pain Management:    Induction: Intravenous  Airway Management Planned: Natural Airway and Nasal Cannula  Additional Equipment:   Intra-op Plan:   Post-operative Plan:   Informed Consent: I have reviewed the patients History and Physical, chart, labs and discussed the procedure including the risks, benefits and alternatives for the proposed anesthesia with the patient or authorized representative who has indicated his/her understanding and acceptance.     Plan Discussed with: CRNA  Anesthesia Plan Comments:         Anesthesia Quick Evaluation

## 2015-12-04 NOTE — Anesthesia Postprocedure Evaluation (Signed)
Anesthesia Post Note  Patient: Benjamin Andrews  Procedure(s) Performed: Procedure(s) (LRB): COLONOSCOPY WITH PROPOFOL (N/A)  Patient location during evaluation: PACU Anesthesia Type: General Level of consciousness: awake Pain management: satisfactory to patient Vital Signs Assessment: post-procedure vital signs reviewed and stable Respiratory status: spontaneous breathing Cardiovascular status: stable Anesthetic complications: no    Last Vitals:  Filed Vitals:   12/04/15 1140 12/04/15 1150  BP: 123/79 127/80  Pulse: 62 59  Temp:    Resp: 14 14    Last Pain: There were no vitals filed for this visit.               VAN STAVEREN,Marcel Gary

## 2015-12-04 NOTE — Op Note (Signed)
[facility] Gastroenterology Patient Name: Benjamin Andrews Procedure Date: 12/04/2015 10:32 AM MRN: [id number] Account #: [id number] Date of Birth: 1966-03-30 Admit Type: Outpatient Age: 50 Room: [facility] ENDO ROOM 1 Gender: Male Note Status: Finalized Procedure:            Colonoscopy Indications:          Screening for colorectal malignant neoplasm Providers:            Manya Silvas, MD Referring MD:         Randall Hiss g. Caryl Bis (Referring MD) Medicines:            Propofol per Anesthesia Complications:        No immediate complications. Procedure:            Pre-Anesthesia Assessment:                       - After reviewing the risks and benefits, the patient                        was deemed in satisfactory condition to undergo the                        procedure.                       After obtaining informed consent, the colonoscope was                        passed under direct vision. Throughout the procedure,                        the patient's blood pressure, pulse, and oxygen                        saturations were monitored continuously. The                        Colonoscope was introduced through the anus and                        advanced to the the cecum, identified by appendiceal                        orifice and ileocecal valve. The colonoscopy was                        performed without difficulty. The patient tolerated the                        procedure well. The quality of the bowel preparation                        was good. Findings:      A small polyp was found in the ascending colon. The polyp was sessile.       The polyp was removed with a hot snare. Resection and retrieval were       complete. One clip applied to the site to prevent bleeding..      Two sessile polyps were found in the transverse colon and ascending       colon. The polyps were diminutive in size. These  polyps were removed       with a jumbo cold forceps. Resection  and retrieval were complete.      Internal hemorrhoids were found during endoscopy. The hemorrhoids were       small and Grade I (internal hemorrhoids that do not prolapse).      The exam was otherwise without abnormality. Impression:           - One small polyp in the ascending colon, removed with                        a hot snare. Resected and retrieved.                       - Two diminutive polyps in the transverse colon and in                        the ascending colon, removed with a jumbo cold forceps.                        Resected and retrieved.                       - Internal hemorrhoids.                       - The examination was otherwise normal. Recommendation:       - Await pathology results. Manya Silvas, MD 12/04/2015 11:29:06 AM This report has been signed electronically. Number of Addenda: 0 Note Initiated On: 12/04/2015 10:32 AM Scope Withdrawal Time: 0 hours 19 minutes 27 seconds  Total Procedure Duration: 0 hours 25 minutes 32 seconds       [facility]

## 2015-12-07 ENCOUNTER — Encounter: Payer: Self-pay | Admitting: Unknown Physician Specialty

## 2015-12-07 LAB — SURGICAL PATHOLOGY

## 2015-12-16 ENCOUNTER — Encounter: Payer: Self-pay | Admitting: Surgical

## 2015-12-30 ENCOUNTER — Ambulatory Visit (INDEPENDENT_AMBULATORY_CARE_PROVIDER_SITE_OTHER): Payer: Managed Care, Other (non HMO) | Admitting: Family Medicine

## 2015-12-30 ENCOUNTER — Encounter: Payer: Self-pay | Admitting: Family Medicine

## 2015-12-30 VITALS — BP 112/68 | HR 62 | Temp 98.3°F | Ht 75.0 in | Wt 252.4 lb

## 2015-12-30 DIAGNOSIS — F411 Generalized anxiety disorder: Secondary | ICD-10-CM

## 2015-12-30 DIAGNOSIS — E7800 Pure hypercholesterolemia, unspecified: Secondary | ICD-10-CM

## 2015-12-30 MED ORDER — SERTRALINE HCL 50 MG PO TABS: 25.0000 mg | ORAL_TABLET | Freq: Every day | ORAL | Status: DC

## 2015-12-30 NOTE — Assessment & Plan Note (Addendum)
Patient reports his anxiety is worsened. GAD 7 score of 12. He is on BuSpar. I discussed treatment options including therapy and SSRI. Does have seizure history and on review it appears SSRIs carry minimal risk of seizure. I discussed this with him. No seizures since 1999. Offered him the options for treatment. And he was amenable to trying Zoloft. We will start him on 25 mg daily. He'll continue his BuSpar. He is given return precautions.

## 2015-12-30 NOTE — Progress Notes (Signed)
Patient ID: Benjamin Andrews, male   DOB: [birth year], 50 y.o.   MRN: [id number]  Benjamin Rumps, MD Phone: [phone]  Benjamin Andrews is a 50 y.o. male who presents today for follow-up.  Elevated LDL: Noted to be 129 on last check. No chest pain or shortness of breath. He does not exercise. He has changed his diet trying to eat fresher vegetables. Eat out less. Less fried foods. No added salt.  Anxiety: Patient notes this is not well controlled. Any kind of conflict bothers him. He gets anxious with any kind a conflict. No depression. No SI or HI. Has never seen a therapist before. Is taking BuSpar 7.5 mg twice daily. Previously was on 15 mg twice daily though did not tolerate this. Has not been on SSRI previously. No desire to see a therapist.   PMH: nonsmoker.   ROSSee history of present illness  Objective  Physical Exam Filed Vitals:   12/30/15 1522  BP: 112/68  Pulse: 62  Temp: 98.3 F (36.8 C)    BP Readings from Last 3 Encounters:  12/30/15 112/68  12/04/15 130/88  09/30/15 126/86   Wt Readings from Last 3 Encounters:  12/30/15 252 lb 6.4 oz (114.488 kg)  12/04/15 240 lb (108.863 kg)  09/30/15 246 lb (111.585 kg)    Physical Exam  Constitutional: He is well-developed, well-nourished, and in no distress.  HENT:  Head: Normocephalic and atraumatic.  Cardiovascular: Normal rate, regular rhythm and normal heart sounds.  Exam reveals no gallop and no friction rub.   No murmur heard. Pulmonary/Chest: Effort normal and breath sounds normal. No respiratory distress. He has no wheezes. He has no rales.  Neurological: He is alert. Gait normal.  Skin: Skin is warm and dry. He is not diaphoretic.  Psychiatric:  Mood anxious, affect normal     Assessment/Plan: Please see individual problem list.  Generalized anxiety disorder Patient reports his anxiety is worsened. GAD 7 score of 12. He is on BuSpar. I discussed treatment options including therapy and SSRI. Does have  seizure history and on review it appears SSRIs carry minimal risk of seizure. I discussed this with him. No seizures since 1999. Offered him the options for treatment. And he was amenable to trying Zoloft. We will start him on 25 mg daily. He'll continue his BuSpar. He is given return precautions.  Elevated LDL cholesterol level ASCVD risk score 3.6%. Discussed diet and exercise. He will continue to work on diet. Consider start exercising. We'll recheck cholesterol in 3 months.    No orders of the defined types were placed in this encounter.    Meds ordered this encounter  Medications  . DISCONTD: sertraline (ZOLOFT) 50 MG tablet    Sig: Take 0.5 tablets (25 mg total) by mouth daily.    Dispense:  30 tablet    Refill:  3  . sertraline (ZOLOFT) 50 MG tablet    Sig: Take 0.5 tablets (25 mg total) by mouth daily.    Dispense:  45 tablet    Refill:  0   Benjamin Rumps, MD [place]

## 2015-12-30 NOTE — Assessment & Plan Note (Signed)
ASCVD risk score 3.6%. Discussed diet and exercise. He will continue to work on diet. Consider start exercising. We'll recheck cholesterol in 3 months.

## 2015-12-30 NOTE — Patient Instructions (Signed)
Nice to see you. We will start you on Zoloft for your anxiety. You should continue to take your BuSpar. You should monitor for seizure activity with this, though this is unlikely. You should work on your diet and exercise for your elevated LDL.  If you develop increasing anxiety, thoughts of harming herself or others, depression, seizures, or any new or changing symptoms please seek medical attention.

## 2015-12-30 NOTE — Progress Notes (Signed)
Pre visit review using our clinic review tool, if applicable. No additional management support is needed unless otherwise documented below in the visit note. 

## 2016-02-03 ENCOUNTER — Ambulatory Visit (INDEPENDENT_AMBULATORY_CARE_PROVIDER_SITE_OTHER): Payer: Managed Care, Other (non HMO) | Admitting: Family Medicine

## 2016-02-03 ENCOUNTER — Encounter: Payer: Self-pay | Admitting: Family Medicine

## 2016-02-03 VITALS — BP 108/70 | HR 60 | Temp 98.2°F | Ht 75.0 in | Wt 249.8 lb

## 2016-02-03 DIAGNOSIS — F411 Generalized anxiety disorder: Secondary | ICD-10-CM

## 2016-02-03 MED ORDER — BUSPIRONE HCL 10 MG PO TABS: 10.0000 mg | ORAL_TABLET | Freq: Two times a day (BID) | ORAL | Status: DC

## 2016-02-03 NOTE — Patient Instructions (Signed)
Nice to see you. We are going to increase your dose of BuSpar to 10 mg twice a day. Please let us know in several weeks you react to this. If you develop depression, worsening anxiety, thoughts of harming herself or others, or new or changing symptoms medical attention.

## 2016-02-03 NOTE — Progress Notes (Signed)
Pre visit review using our clinic review tool, if applicable. No additional management support is needed unless otherwise documented below in the visit note. 

## 2016-02-03 NOTE — Assessment & Plan Note (Signed)
Slightly worse than previously. He is back on BuSpar 7.5 mg 3 times a day. Given slight worsening we will increase him to 10 mg of BuSpar twice daily and see how he tolerates this. He will let us know if he is unable to tolerate this. He is given return precautions.

## 2016-02-03 NOTE — Progress Notes (Signed)
Patient ID: Benjamin Andrews, male   DOB: [birth year], 50 y.o.   MRN: [id number]  Benjamin Rumps, MD Phone: [phone]  Benjamin Andrews is a 50 y.o. male who presents today for follow-up.  Anxiety: Patient notes his anxiety is slightly worse than the last time we saw each other. Notes straying from his routine makes him more anxious. He was on an SSRI after his last visit though notes he had indigestion and diarrhea with this so he stopped it. He is started back on 7.5 mg of BuSpar twice daily. He denies depression, SI, and HI. Notes in the past he has been on 15 mg of BuSpar though at this he didn't feel overall weak.  PMH: nonsmoker.   ROS see history of present illness  Objective  Physical Exam Filed Vitals:   02/03/16 1532  BP: 108/70  Pulse: 60  Temp: 98.2 F (36.8 C)    BP Readings from Last 3 Encounters:  02/03/16 108/70  12/30/15 112/68  12/04/15 130/88   Wt Readings from Last 3 Encounters:  02/03/16 249 lb 12.8 oz (113.309 kg)  12/30/15 252 lb 6.4 oz (114.488 kg)  12/04/15 240 lb (108.863 kg)    Physical Exam  Constitutional: He is well-developed, well-nourished, and in no distress.  HENT:  Head: Normocephalic and atraumatic.  Cardiovascular: Normal rate, regular rhythm and normal heart sounds.   Pulmonary/Chest: Effort normal and breath sounds normal.  Neurological: He is alert. Gait normal.  Skin: Skin is warm and dry. He is not diaphoretic.  Psychiatric: Affect normal.  Mood anxious     Assessment/Plan: Please see individual problem list.  Generalized anxiety disorder Slightly worse than previously. He is back on BuSpar 7.5 mg 3 times a day. Given slight worsening we will increase him to 10 mg of BuSpar twice daily and see how he tolerates this. He will let us know if he is unable to tolerate this. He is given return precautions.    No orders of the defined types were placed in this encounter.    Meds ordered this encounter  Medications  . busPIRone  (BUSPAR) 10 MG tablet    Sig: Take 1 tablet (10 mg total) by mouth 2 (two) times daily.    Dispense:  180 tablet    Refill:  [place], MD [place]

## 2016-03-16 ENCOUNTER — Ambulatory Visit (INDEPENDENT_AMBULATORY_CARE_PROVIDER_SITE_OTHER): Payer: Managed Care, Other (non HMO) | Admitting: Family Medicine

## 2016-03-16 ENCOUNTER — Encounter: Payer: Self-pay | Admitting: Family Medicine

## 2016-03-16 VITALS — BP 110/72 | HR 55 | Temp 98.3°F | Ht 75.0 in | Wt 246.2 lb

## 2016-03-16 DIAGNOSIS — F411 Generalized anxiety disorder: Secondary | ICD-10-CM | POA: Diagnosis not present

## 2016-03-16 DIAGNOSIS — N529 Male erectile dysfunction, unspecified: Secondary | ICD-10-CM | POA: Diagnosis not present

## 2016-03-16 NOTE — Assessment & Plan Note (Signed)
Chronic stable issue. Uses sildenafil as needed. No cardiac symptoms. Continue to monitor. Can continue sildenafil.

## 2016-03-16 NOTE — Progress Notes (Signed)
Pre visit review using our clinic review tool, if applicable. No additional management support is needed unless otherwise documented below in the visit note. 

## 2016-03-16 NOTE — Assessment & Plan Note (Signed)
Significantly improved. Continue BuSpar. Continue to monitor. Follow-up in 3 months. Given return precautions.

## 2016-03-16 NOTE — Progress Notes (Signed)
Patient ID: Benjamin Andrews, male   DOB: [birth year], 50 y.o.   MRN: [id number]  [name], MD Phone: [phone]  Benjamin Andrews is a 49 y.o. male who presents today for follow-up.  Anxiety: Patient notes this is significantly improved. Rarely feels anxious. Having a sense of the unknown brings his anxiety on. Only last briefly and goes away once he is done the activity that is unknown. No depression. Stable on BuSpar.  Erectile dysfunction: Takes sildenafil as needed. Notes this is beneficial in attaining an erection. He has no chest pain or shortness of breath with physical activity or sexual activity. Does not need refills on his sildenafil.  PMH: nonsmoker.   ROS see history of present illness  Objective  Physical Exam Filed Vitals:   03/16/16 1525  BP: 110/72  Pulse: 55  Temp: 98.3 F (36.8 C)    BP Readings from Last 3 Encounters:  03/16/16 110/72  02/03/16 108/70  12/30/15 112/68   Wt Readings from Last 3 Encounters:  03/16/16 246 lb 3.2 oz (111.676 kg)  02/03/16 249 lb 12.8 oz (113.309 kg)  12/30/15 252 lb 6.4 oz (114.488 kg)    Physical Exam  Constitutional: He is well-developed, well-nourished, and in no distress.  HENT:  Head: Normocephalic and atraumatic.  Cardiovascular: Normal rate, regular rhythm and normal heart sounds.   Pulmonary/Chest: Effort normal and breath sounds normal.  Neurological: He is alert. Gait normal.  Skin: Skin is warm and dry. He is not diaphoretic.  Psychiatric: Mood and affect normal.     Assessment/Plan: Please see individual problem list.  Generalized anxiety disorder Significantly improved. Continue BuSpar. Continue to monitor. Follow-up in 3 months. Given return precautions.  Erectile dysfunction Chronic stable issue. Uses sildenafil as needed. No cardiac symptoms. Continue to monitor. Can continue sildenafil.   [name], MD [place]

## 2016-03-16 NOTE — Patient Instructions (Signed)
Nice to see you. I'm glad you're doing better. Please continue BuSpar 10 mg twice daily. If you develop worsening anxiety or develop depression please let us know.

## 2016-04-18 ENCOUNTER — Other Ambulatory Visit: Payer: Self-pay | Admitting: Surgical

## 2016-04-18 MED ORDER — BUSPIRONE HCL 10 MG PO TABS: 10.0000 mg | ORAL_TABLET | Freq: Two times a day (BID) | ORAL | Status: DC

## 2016-06-16 ENCOUNTER — Ambulatory Visit (INDEPENDENT_AMBULATORY_CARE_PROVIDER_SITE_OTHER): Payer: Managed Care, Other (non HMO) | Admitting: Family Medicine

## 2016-06-16 ENCOUNTER — Encounter: Payer: Self-pay | Admitting: Family Medicine

## 2016-06-16 VITALS — BP 110/72 | HR 56 | Temp 98.4°F | Wt 236.8 lb

## 2016-06-16 DIAGNOSIS — L989 Disorder of the skin and subcutaneous tissue, unspecified: Secondary | ICD-10-CM | POA: Diagnosis not present

## 2016-06-16 DIAGNOSIS — E669 Obesity, unspecified: Secondary | ICD-10-CM | POA: Insufficient documentation

## 2016-06-16 DIAGNOSIS — E663 Overweight: Secondary | ICD-10-CM | POA: Insufficient documentation

## 2016-06-16 DIAGNOSIS — F411 Generalized anxiety disorder: Secondary | ICD-10-CM | POA: Diagnosis not present

## 2016-06-16 NOTE — Assessment & Plan Note (Signed)
Improved.  Continue BuSpar. 

## 2016-06-16 NOTE — Progress Notes (Signed)
  [name], MD Phone: [phone]  Benjamin Andrews is a 50 y.o. male who presents today for follow-up.  Anxiety: Patient notes this is better. Notes it is 75-80 percent better. Mostly gets anxious when he is interacting with other people. Is taking BuSpar and this has been helpful.  Patient notes there is a spot on his left cheek that has been present for 3-4 months. Notes it is overly sensitive and sore to touch. Lots of sun exposure when he was younger. Dad with history of skin cancer.  Obesity: Notes he has lost some weight. Has been eating healthier and riding bikes. Trying to lose more weight.   PMH: nonsmoker.   ROS see history of present illness  Objective  Physical Exam Vitals:   06/16/16 1526  BP: 110/72  Pulse: (!) 56  Temp: 98.4 F (36.9 C)    BP Readings from Last 3 Encounters:  06/16/16 110/72  03/16/16 110/72  02/03/16 108/70   Wt Readings from Last 3 Encounters:  06/16/16 236 lb 12.8 oz (107.4 kg)  03/16/16 246 lb 3.2 oz (111.7 kg)  02/03/16 249 lb 12.8 oz (113.3 kg)    Physical Exam  Constitutional: No distress.  Cardiovascular: Normal rate, regular rhythm and normal heart sounds.   Pulmonary/Chest: Effort normal and breath sounds normal.  Neurological: He is alert. Gait normal.  Skin: He is not diaphoretic.  Left upper cheek with area about half a centimeter in diameter that is erythematous and rough  Psychiatric: Mood and affect normal.     Assessment/Plan: Please see individual problem list.  Generalized anxiety disorder Improved. Continue BuSpar.  Skin lesion of face Could be squamous cell. We'll refer to dermatology for further evaluation.  Obesity Weight has improved. He'll continue diet and exercise.   Orders Placed This Encounter  Procedures  . Ambulatory referral to Dermatology    Referral Priority:   Routine    Referral Type:   Consultation    Referral Reason:   Specialty Services Required    Requested Specialty:    Dermatology    Number of Visits Requested:   1    [name], MD [place]

## 2016-06-16 NOTE — Progress Notes (Signed)
Pre visit review using our clinic review tool, if applicable. No additional management support is needed unless otherwise documented below in the visit note. 

## 2016-06-16 NOTE — Assessment & Plan Note (Signed)
Weight has improved. He'll continue diet and exercise.

## 2016-06-16 NOTE — Patient Instructions (Signed)
Nice to see you. We will refer you to dermatology. Please continue your BuSpar. Please continue to eat healthier and ride bikes.

## 2016-06-16 NOTE — Assessment & Plan Note (Signed)
Could be squamous cell. We'll refer to dermatology for further evaluation.

## 2016-08-29 ENCOUNTER — Encounter: Payer: Self-pay | Admitting: Emergency Medicine

## 2016-08-29 ENCOUNTER — Emergency Department
Admission: EM | Admit: 2016-08-29 | Discharge: 2016-08-29 | Disposition: A | Discharge status: Home / Self Care | Payer: Managed Care, Other (non HMO) | Attending: Emergency Medicine | Admitting: Emergency Medicine

## 2016-08-29 DIAGNOSIS — Z79899 Other long term (current) drug therapy: Secondary | ICD-10-CM | POA: Diagnosis not present

## 2016-08-29 DIAGNOSIS — Z87891 Personal history of nicotine dependence: Secondary | ICD-10-CM | POA: Diagnosis not present

## 2016-08-29 DIAGNOSIS — R4182 Altered mental status, unspecified: Secondary | ICD-10-CM | POA: Diagnosis present

## 2016-08-29 DIAGNOSIS — G40909 Epilepsy, unspecified, not intractable, without status epilepticus: Secondary | ICD-10-CM | POA: Insufficient documentation

## 2016-08-29 DIAGNOSIS — R569 Unspecified convulsions: Secondary | ICD-10-CM

## 2016-08-29 LAB — CBC
HEMATOCRIT: 51.1 % (ref 40.0–52.0)
HEMOGLOBIN: 17.1 g/dL (ref 13.0–18.0)
MCH: 30.1 pg (ref 26.0–34.0)
MCHC: 33.4 g/dL (ref 32.0–36.0)
MCV: 89.9 fL (ref 80.0–100.0)
Platelets: 134 10*3/uL — ABNORMAL LOW (ref 150–440)
RBC: 5.69 MIL/uL (ref 4.40–5.90)
RDW: 13.6 % (ref 11.5–14.5)
WBC: 10 10*3/uL (ref 3.8–10.6)

## 2016-08-29 LAB — COMPREHENSIVE METABOLIC PANEL
ALBUMIN: 4.2 g/dL (ref 3.5–5.0)
ALK PHOS: 70 U/L (ref 38–126)
ALT: 26 U/L (ref 17–63)
ANION GAP: 8 (ref 5–15)
AST: 32 U/L (ref 15–41)
BILIRUBIN TOTAL: 0.7 mg/dL (ref 0.3–1.2)
BUN: 19 mg/dL (ref 6–20)
CO2: 25 mmol/L (ref 22–32)
Calcium: 9.2 mg/dL (ref 8.9–10.3)
Chloride: 104 mmol/L (ref 101–111)
Creatinine, Ser: 1.12 mg/dL (ref 0.61–1.24)
Glucose, Bld: 122 mg/dL — ABNORMAL HIGH (ref 65–99)
Potassium: 3.6 mmol/L (ref 3.5–5.1)
SODIUM: 137 mmol/L (ref 135–145)
TOTAL PROTEIN: 7 g/dL (ref 6.5–8.1)

## 2016-08-29 LAB — TROPONIN I: Troponin I: <0.03 ng/mL (ref <0.03)

## 2016-08-29 NOTE — ED Triage Notes (Signed)
Per ems pt with "bizzare behavior" ems states pt's roommate called ems reference pt wandering in her room and acting strangely. Pt with history of seizures, "nocturnal" per pt. Ems states on their arrival pt was not speaking, pale and diaphoretic. Ems states as they transported pt he became less pale, less diaphoretic and more aware of situation. Pt states the last thing he remembers is watching football on tv. Pt alert to self, and place only at this time. fsbs for ems of 116. perrl 75mm and brisk.

## 2016-08-29 NOTE — Discharge Instructions (Signed)

## 2016-08-29 NOTE — ED Provider Notes (Signed)
[facility] Emergency Department Provider Note  ____________________________________________   First MD Initiated Contact with Patient 08/29/16 [phone]     (approximate)  I have reviewed the triage vital signs and the nursing notes.   HISTORY  Chief Complaint Altered Mental Status    HPI Benjamin Andrews is a 50 y.o. male who has a history of seizure disorder and has been on Trileptal for 17 years.  He was brought in tonight by EMS after an apparent seizure episode.  The nursing note describes the episode in detail, but apparently he got up in the middle of night and was acting confused and postictal and does not remember the episode.  He states that his body aches all over as he used to after he had seizures many years ago.  To baseline shortly after coming to the emergency department.  He has no acute injuries including no injuries to his tongue or extremities.  He states that he dislocated his shoulder multiple times many years ago and that was how the seizures wereoriginally discovered.  They seemed to only happen at night.  He has not had another one of which she is aware since he has been on the Trileptal, but he states that "for whatever the reason" he has not taken his Trileptal for the last several days.  He still has a good supply of them.  He has a regular doctor with whom he can follow-up.  He has no headache, neck pain, recent dysuria, respiratory symptoms, or any other recent illness.  He states the only other thing that may have led to the seizure episode was that he worked out more heavily than usual yesterday and he remembers some correlation with that back 17 years ago when he was first diagnosed.   Past Medical History:  Diagnosis Date  . Allergic rhinitis   . Blood in stool   . Chickenpox   . GERD (gastroesophageal reflux disease)   . Headache   . Hemorrhoids   . High blood pressure   . Seizures ([place])   . Sleep apnea   . UTI (lower urinary tract  infection)     Patient Active Problem List   Diagnosis Date Noted  . Skin lesion of face 06/16/2016  . Obesity 06/16/2016  . Erectile dysfunction 03/16/2016  . Elevated LDL cholesterol level 12/30/2015  . Generalized anxiety disorder 09/30/2015  . Annual physical exam 09/30/2015    Past Surgical History:  Procedure Laterality Date  . broken nose    . COLONOSCOPY WITH PROPOFOL N/A 12/04/2015   Procedure: COLONOSCOPY WITH PROPOFOL;  Surgeon: Manya Silvas, MD;  Location: [facility] ENDOSCOPY;  Service: Endoscopy;  Laterality: N/A;  . SHOULDER SURGERY      Prior to Admission medications   Medication Sig Start Date End Date Taking? Authorizing Provider  Biotin (BIOTIN 5000) 5 MG CAPS Take by mouth.   Yes Historical Provider, MD  busPIRone (BUSPAR) 10 MG tablet Take 1 tablet (10 mg total) by mouth 2 (two) times daily. 04/18/16  Yes Leone Haven, MD  Multiple Vitamin (MULTIVITAMIN) tablet Take 1 tablet by mouth daily.   Yes Historical Provider, MD  Omega-3 Fatty Acids (FISH OIL CONCENTRATE PO) Take by mouth.   Yes Historical Provider, MD  Oxcarbazepine (TRILEPTAL) 300 MG tablet Take 300 mg by mouth 2 (two) times daily.   Yes Historical Provider, MD    Allergies Patient has no known allergies.  Family History  Problem Relation Age of Onset  .  Alcoholism      Grandparent  . Arthritis      Grandparent  . Stroke      Parent  . Diabetes      Grandparent  . Rectal cancer Maternal Uncle     Social History Social History  Substance Use Topics  . Smoking status: Never Smoker  . Smokeless tobacco: Former Systems developer  . Alcohol use 0.0 oz/week     Comment: Infrequently    Review of Systems Constitutional: No fever/chills.   Eyes: No visual changes. ENT: No sore throat. Cardiovascular: Denies chest pain. Respiratory: Denies shortness of breath. Gastrointestinal: No abdominal pain.  No nausea, no vomiting.  No diarrhea.  No constipation. Genitourinary: Negative for  dysuria. Musculoskeletal: Negative for back pain.  Generalized muscle soreness. Skin: Negative for rash. Neurological: Negative for headaches, focal weakness or numbness.  10-point ROS otherwise negative.  ____________________________________________   PHYSICAL EXAM:  VITAL SIGNS: ED Triage Vitals [08/29/16 0048]  Enc Vitals Group     BP 132/84     Pulse Rate 81     Resp 18     Temp      Temp Source Oral     SpO2 99 %     Weight 232 lb (105.2 kg)     Height 6\' 4"  (1.93 m)     Head Circumference      Peak Flow      Pain Score      Pain Loc      Pain Edu?      Excl. in [place]?     Constitutional: Alert and oriented. Well appearing and in no acute distress. Eyes: Conjunctivae are normal. PERRL. EOMI. Head: Atraumatic. Nose: No congestion/rhinnorhea. Mouth/Throat: Mucous membranes are moist.  Oropharynx non-erythematous.  No tongue injuries. Neck: No stridor.  No meningeal signs.   Cardiovascular: Normal rate, regular rhythm. Good peripheral circulation. Grossly normal heart sounds. Respiratory: Normal respiratory effort.  No retractions. Lungs CTAB. Gastrointestinal: Soft and nontender. No distention.  Musculoskeletal: No lower extremity tenderness nor edema. No gross deformities of extremities.  No difficulty with ROM of arms/shoulders. Neurologic:  Normal speech and language. No gross focal neurologic deficits are appreciated.  Skin:  Skin is warm, dry and intact. No rash noted. Psychiatric: Mood and affect are normal. Speech and behavior are normal.  ____________________________________________   LABS (all labs ordered are listed, but only abnormal results are displayed)  Labs Reviewed  COMPREHENSIVE METABOLIC PANEL - Abnormal; Notable for the following:       Result Value   Glucose, Bld 122 (*)    All other components within normal limits  CBC - Abnormal; Notable for the following:    Platelets 134 (*)    All other components within normal limits  TROPONIN I   URINALYSIS COMPLETEWITH MICROSCOPIC ([facility] ONLY)   ____________________________________________  EKG  ED ECG REPORT I, Shirlena Brinegar, the attending physician, personally viewed and interpreted this ECG.  Date: 08/29/2016 EKG Time: 00:50 Rate: 82 Rhythm: normal sinus rhythm QRS Axis: normal Intervals: normal ST/T Wave abnormalities: normal Conduction Disturbances: none Narrative Interpretation: unremarkable  ____________________________________________  RADIOLOGY   No results found.  ____________________________________________   PROCEDURES  Procedure(s) performed:   Procedures   Critical Care performed: No ____________________________________________   INITIAL IMPRESSION / ASSESSMENT AND PLAN / ED COURSE  Pertinent labs & imaging results that were available during my care of the patient were reviewed by me and considered in my medical decision making (see chart for details).  The  patient is well-appearing and in NAD.  He has been stable in the ED.  Normal lab work, no recent infectious symptoms.  Discussed extensively with the patient.  We agreed no other workup needed at this time.  Giving a dose of his regular Trileptal.  Patient comfortable going home with outpatient follow up.  Gave usual/customary return precautions.    ____________________________________________  FINAL CLINICAL IMPRESSION(S) / ED DIAGNOSES  Final diagnoses:  Seizure ([place])     MEDICATIONS GIVEN DURING THIS VISIT:  Medications - No data to display   NEW OUTPATIENT MEDICATIONS STARTED DURING THIS VISIT:  New Prescriptions   No medications on file    Modified Medications   No medications on file    Discontinued Medications   SILDENAFIL (REVATIO) 20 MG TABLET    Take 20 mg by mouth 3 (three) times daily.     Note:  This document was prepared using Dragon voice recognition software and may include unintentional dictation errors.    Hinda Kehr, MD 08/29/16 [phone]

## 2016-08-29 NOTE — ED Notes (Signed)
Lights dimmed for comfort. Pt updated on delay.

## 2016-08-29 NOTE — ED Notes (Addendum)
Pt updated on delay and readjusted in bed for comfort. Pt verbalizes understanding. Pt alert and oriented x4 at this time and appears in no acute distress. Pt did not have oral trauma or loss of bowel or bladder pta.

## 2016-10-13 ENCOUNTER — Ambulatory Visit (INDEPENDENT_AMBULATORY_CARE_PROVIDER_SITE_OTHER): Payer: Managed Care, Other (non HMO) | Admitting: Family Medicine

## 2016-10-13 ENCOUNTER — Encounter: Payer: Self-pay | Admitting: Family Medicine

## 2016-10-13 VITALS — BP 120/80 | HR 61 | Temp 97.8°F | Ht 75.0 in | Wt 250.0 lb

## 2016-10-13 DIAGNOSIS — Z125 Encounter for screening for malignant neoplasm of prostate: Secondary | ICD-10-CM

## 2016-10-13 DIAGNOSIS — Z0000 Encounter for general adult medical examination without abnormal findings: Secondary | ICD-10-CM

## 2016-10-13 DIAGNOSIS — R569 Unspecified convulsions: Secondary | ICD-10-CM

## 2016-10-13 DIAGNOSIS — F411 Generalized anxiety disorder: Secondary | ICD-10-CM | POA: Diagnosis not present

## 2016-10-13 DIAGNOSIS — Z0001 Encounter for general adult medical examination with abnormal findings: Secondary | ICD-10-CM

## 2016-10-13 DIAGNOSIS — Z13220 Encounter for screening for lipoid disorders: Secondary | ICD-10-CM

## 2016-10-13 NOTE — Patient Instructions (Signed)
Nice to see you. Please continue to work on diet and exercise. We will have you return for fasting lab work. Once these labs return and we will give you a call. Please continue BuSpar. You can do meditation or deep breathing exercises to help with the anxiety.

## 2016-10-13 NOTE — Progress Notes (Signed)
Benjamin Rumps, MD Phone: [phone]  Benjamin Andrews is a 50 y.o. male who presents today for physical exam.  Diet consists of 3 meals a day. Typically oatmeal for breakfast, sandwich for lunch and then a well-balanced meal at dinner. No sweet tea. Tries to exercise by biking.  Up-to-date on tetanus vaccination. Declines flu vaccination. Up-to-date on colonoscopy. No prior HIV testing. No prior prostate cancer screening. No tobacco use. Does have a history of dip use. Rare alcohol use. No illicit drug use. Sees a dentist twice a year. Ophthalmologist 2 years ago.  Continues to have issues with stress and anxiety. Doesn't have as much butterfly issue though occasionally feels as though he needs to take a deep breath to calm down. No shortness of breath or chest pain. Feels anxious when this occurs. Improves with a single deep breath. BuSpar has been beneficial.  History of seizures: He had a single seizure earlier this year. Was evaluated in the emergency room and he had not been taking his Trileptal. Notes he is taking this on a daily basis now. Has follow-up with his neurologist already scheduled. No additional seizures.  Active Ambulatory Problems    Diagnosis Date Noted  . Generalized anxiety disorder 09/30/2015  . Encounter for general adult medical examination with abnormal findings 09/30/2015  . Elevated LDL cholesterol level 12/30/2015  . Erectile dysfunction 03/16/2016  . Skin lesion of face 06/16/2016  . Obesity 06/16/2016  . Seizures ([place]) 10/14/2016   Resolved Ambulatory Problems    Diagnosis Date Noted  . No Resolved Ambulatory Problems   Past Medical History:  Diagnosis Date  . Allergic rhinitis   . Blood in stool   . Chickenpox   . GERD (gastroesophageal reflux disease)   . Headache   . Hemorrhoids   . High blood pressure   . Seizures ([place])   . Sleep apnea   . UTI (lower urinary tract infection)     Family History  Problem Relation Age of Onset  .  Alcoholism      Grandparent  . Arthritis      Grandparent  . Stroke      Parent  . Diabetes      Grandparent  . Rectal cancer Maternal Uncle     Social History   Social History  . Marital status: Divorced    Spouse name: N/A  . Number of children: N/A  . Years of education: N/A   Occupational History  . Not on file.   Social History Main Topics  . Smoking status: Never Smoker  . Smokeless tobacco: Former Systems developer  . Alcohol use 0.0 oz/week     Comment: Infrequently  . Drug use: No  . Sexual activity: Not on file   Other Topics Concern  . Not on file   Social History Narrative  . No narrative on file    ROS  General:  Negative for nexplained weight loss, fever Skin: Negative for new or changing mole, sore that won't heal HEENT: Negative for trouble hearing, trouble seeing, ringing in ears, mouth sores, hoarseness, change in voice, dysphagia. CV:  Negative for chest pain, dyspnea, edema, palpitations Resp: Negative for cough, dyspnea, hemoptysis GI: Negative for nausea, vomiting, diarrhea, constipation, abdominal pain, melena, hematochezia. GU: Positive for sexual difficulty, Negative for dysuria, incontinence, urinary hesitance, hematuria, vaginal or penile discharge, polyuria, lumps in testicle or breasts MSK: Negative for muscle cramps or aches, joint pain or swelling Neuro: Negative for headaches, weakness, numbness, dizziness, passing out/fainting Psych: Positive for  anxiety, Negative for depression, memory problems  Objective  Physical Exam Vitals:   10/13/16 0837  BP: 120/80  Pulse: 61  Temp: 97.8 F (36.6 C)    BP Readings from Last 3 Encounters:  10/13/16 120/80  08/29/16 123/76  06/16/16 110/72   Wt Readings from Last 3 Encounters:  10/13/16 250 lb (113.4 kg)  08/29/16 232 lb (105.2 kg)  06/16/16 236 lb 12.8 oz (107.4 kg)    Physical Exam  Constitutional: He is well-developed, well-nourished, and in no distress.  HENT:  Head:  Normocephalic and atraumatic.  Mouth/Throat: Oropharynx is clear and moist. No oropharyngeal exudate.  Eyes: Conjunctivae are normal. Pupils are equal, round, and reactive to light.  Cardiovascular: Normal rate, regular rhythm and normal heart sounds.   Pulmonary/Chest: Effort normal and breath sounds normal.  Abdominal: Soft. Bowel sounds are normal. He exhibits no distension. There is no tenderness. There is no rebound and no guarding.  Genitourinary: Rectum normal and prostate normal.  Musculoskeletal: He exhibits no edema.  Neurological: He is alert. Gait normal.  Skin: Skin is warm and dry.  Psychiatric: Mood and affect normal.     Assessment/Plan:   Encounter for general adult medical examination with abnormal findings Overall doing well. He is obese. I discussed diet and exercise with him. Blood pressure well controlled. He declines flu shot. Lab work will be obtained as outlined below. Prostate exam normal today. I had a long shared decision making discussion with patient regarding prostate cancer screening and we decided to hold off on PSA at this time. The PSA order was not canceled the day of his appointment and I spoke with the lab technician and we will cancel this today so that will not be run through our lab.  Generalized anxiety disorder Somewhat improved. Does sound like he is having some very mild panic attacks and anxiety attacks. Discussed deep breathing exercises and meditation. Offered other medications or therapy and he declined those. He'll continue to monitor.  Seizures ([place]) Patient with history of seizures followed by neurology. He is currently on Trileptal. Had a seizure earlier this year when he was not taking his medications consistently. Currently taking it daily. He'll keep his follow-up with neurology.   Orders Placed This Encounter  Procedures  . HIV antibody (with reflex)    Standing Status:   Future    Number of Occurrences:   1    Standing  Expiration Date:   10/13/2017  . PSA    Standing Status:   Future    Number of Occurrences:   1    Standing Expiration Date:   10/13/2017  . Comp Met (CMET)    Standing Status:   Future    Number of Occurrences:   1    Standing Expiration Date:   10/13/2017  . HgB A1c    Standing Status:   Future    Number of Occurrences:   1    Standing Expiration Date:   10/13/2017  . Lipid Profile    Standing Status:   Future    Number of Occurrences:   1    Standing Expiration Date:   10/13/2017    No orders of the defined types were placed in this encounter.    Benjamin Rumps, MD [place]

## 2016-10-13 NOTE — Progress Notes (Signed)
Pre visit review using our clinic review tool, if applicable. No additional management support is needed unless otherwise documented below in the visit note. 

## 2016-10-14 ENCOUNTER — Ambulatory Visit: Payer: Managed Care, Other (non HMO) | Admitting: Family Medicine

## 2016-10-14 ENCOUNTER — Other Ambulatory Visit (INDEPENDENT_AMBULATORY_CARE_PROVIDER_SITE_OTHER): Payer: Managed Care, Other (non HMO)

## 2016-10-14 DIAGNOSIS — R569 Unspecified convulsions: Secondary | ICD-10-CM | POA: Insufficient documentation

## 2016-10-14 DIAGNOSIS — Z0001 Encounter for general adult medical examination with abnormal findings: Secondary | ICD-10-CM

## 2016-10-14 DIAGNOSIS — Z13220 Encounter for screening for lipoid disorders: Secondary | ICD-10-CM | POA: Diagnosis not present

## 2016-10-14 DIAGNOSIS — Z125 Encounter for screening for malignant neoplasm of prostate: Secondary | ICD-10-CM

## 2016-10-14 LAB — COMPREHENSIVE METABOLIC PANEL
ALBUMIN: 4.5 g/dL (ref 3.5–5.2)
ALT: 39 U/L (ref 0–53)
AST: 26 U/L (ref 0–37)
Alkaline Phosphatase: 78 U/L (ref 39–117)
BILIRUBIN TOTAL: 0.7 mg/dL (ref 0.2–1.2)
BUN: 15 mg/dL (ref 6–23)
CHLORIDE: 102 meq/L (ref 96–112)
CO2: 33 mEq/L — ABNORMAL HIGH (ref 19–32)
CREATININE: 0.95 mg/dL (ref 0.40–1.50)
Calcium: 9.5 mg/dL (ref 8.4–10.5)
GFR: 88.79 mL/min (ref >60.00)
Glucose, Bld: 91 mg/dL (ref 70–99)
Potassium: 5 mEq/L (ref 3.5–5.1)
SODIUM: 141 meq/L (ref 135–145)
TOTAL PROTEIN: 7 g/dL (ref 6.0–8.3)

## 2016-10-14 LAB — LIPID PANEL
Cholesterol: 204 mg/dL — ABNORMAL HIGH (ref 0–200)
HDL: 36.8 mg/dL — ABNORMAL LOW (ref >39.00)
LDL CALC: 138 mg/dL — AB (ref 0–99)
NONHDL: 167.32
Total CHOL/HDL Ratio: 6
Triglycerides: 149 mg/dL (ref 0.0–149.0)
VLDL: 29.8 mg/dL (ref 0.0–40.0)

## 2016-10-14 LAB — HEMOGLOBIN A1C: HEMOGLOBIN A1C: 5.4 % (ref 4.6–6.5)

## 2016-10-14 NOTE — Assessment & Plan Note (Signed)
Patient with history of seizures followed by neurology. He is currently on Trileptal. Had a seizure earlier this year when he was not taking his medications consistently. Currently taking it daily. He'll keep his follow-up with neurology.

## 2016-10-14 NOTE — Assessment & Plan Note (Signed)
Somewhat improved. Does sound like he is having some very mild panic attacks and anxiety attacks. Discussed deep breathing exercises and meditation. Offered other medications or therapy and he declined those. He'll continue to monitor.

## 2016-10-14 NOTE — Addendum Note (Signed)
Addended by: Frutoso Chase A on: 10/14/2016 11:01 AM   Modules accepted: Orders

## 2016-10-14 NOTE — Assessment & Plan Note (Addendum)
Overall doing well. He is obese. I discussed diet and exercise with him. Blood pressure well controlled. He declines flu shot. Lab work will be obtained as outlined below. Prostate exam normal today. I had a long shared decision making discussion with patient regarding prostate cancer screening and we decided to hold off on PSA at this time. The PSA order was not canceled the day of his appointment and I spoke with the lab technician and we will cancel this today so that will not be run through our lab.

## 2016-10-15 LAB — HIV ANTIBODY (ROUTINE TESTING W REFLEX): HIV 1&2 Ab, 4th Generation: NONREACTIVE

## 2016-10-29 ENCOUNTER — Other Ambulatory Visit: Payer: Self-pay | Admitting: Family Medicine

## 2016-10-31 NOTE — Telephone Encounter (Signed)
Last OV 10/13/16 last filled 04/18/16 180 1rf

## 2017-01-20 ENCOUNTER — Other Ambulatory Visit: Payer: Self-pay | Admitting: Family Medicine

## 2017-04-14 ENCOUNTER — Ambulatory Visit: Payer: Managed Care, Other (non HMO) | Admitting: Family Medicine

## 2017-04-14 DIAGNOSIS — Z0289 Encounter for other administrative examinations: Secondary | ICD-10-CM

## 2017-05-24 ENCOUNTER — Ambulatory Visit (INDEPENDENT_AMBULATORY_CARE_PROVIDER_SITE_OTHER): Payer: Managed Care, Other (non HMO) | Admitting: Family Medicine

## 2017-05-24 DIAGNOSIS — E7800 Pure hypercholesterolemia, unspecified: Secondary | ICD-10-CM | POA: Diagnosis not present

## 2017-05-24 DIAGNOSIS — S86812A Strain of other muscle(s) and tendon(s) at lower leg level, left leg, initial encounter: Secondary | ICD-10-CM

## 2017-05-24 DIAGNOSIS — F411 Generalized anxiety disorder: Secondary | ICD-10-CM | POA: Diagnosis not present

## 2017-05-25 ENCOUNTER — Encounter: Payer: Self-pay | Admitting: Family Medicine

## 2017-05-25 DIAGNOSIS — S86819A Strain of other muscle(s) and tendon(s) at lower leg level, unspecified leg, initial encounter: Secondary | ICD-10-CM | POA: Insufficient documentation

## 2017-05-25 NOTE — Progress Notes (Signed)
  [name], MD Phone: [phone]  Benjamin Andrews is a 51 y.o. male who presents today for follow-up.  Anxiety: Patient notes this is quite a bit better. Notes the BuSpar has been helpful. He notes no depression, SI, or HI. Notes doing anything out of the ordinary makes him anxious.  Elevated LDL: Notes he has changed his diet and exercise quite a bit. He is eating mostly vegetables and lean meats. Also salads. He's been biking 17-33 miles on a weekend. He tried to start jogging though he injured his left calf while doing this.  Patient notes while jogging one to 2 weeks ago he felt a pain in the left mid lateral calf muscle. Notes anytime he tries to start running now there is a pain. Notes no swelling. Notes it just feels tight. He's been using ice and heat. Also massaging with a tennis ball. No history of blood clot. No unilateral swelling.  PMH: nonsmoker.   ROS see history of present illness  Objective  Physical Exam Vitals:   05/25/17 0900  BP: 122/70  Pulse: (!) 52  Temp: 98.3 F (36.8 C)    BP Readings from Last 3 Encounters:  05/25/17 122/70  10/13/16 120/80  08/29/16 123/76   Wt Readings from Last 3 Encounters:  05/25/17 244 lb 9.6 oz (110.9 kg)  10/13/16 250 lb (113.4 kg)  08/29/16 232 lb (105.2 kg)    Physical Exam  Constitutional: No distress.  Cardiovascular: Normal rate, regular rhythm and normal heart sounds.   Pulmonary/Chest: Effort normal and breath sounds normal.  Musculoskeletal: He exhibits no edema.       Legs: Neurological: He is alert. Gait normal.  Skin: He is not diaphoretic.     Assessment/Plan: Please see individual problem list.  Generalized anxiety disorder Doing well. He would like to continue the BuSpar.  Elevated LDL cholesterol level Encouraged continued diet and exercise changes.  Strain of calf muscle History and exam most consistent with calf strain. Discussed ice and heat. Also massage. If not improving over the  next 1-2 weeks we can refer to sports medicine.   [name], MD [place]

## 2017-05-25 NOTE — Assessment & Plan Note (Signed)
History and exam most consistent with calf strain. Discussed ice and heat. Also massage. If not improving over the next 1-2 weeks we can refer to sports medicine.

## 2017-05-25 NOTE — Assessment & Plan Note (Signed)
Doing well. He would like to continue the BuSpar.

## 2017-05-25 NOTE — Assessment & Plan Note (Signed)
Encouraged continued diet and exercise changes. °

## 2017-05-30 ENCOUNTER — Telehealth: Payer: Self-pay | Admitting: Family Medicine

## 2017-05-30 NOTE — Telephone Encounter (Signed)
Pt called and stated that he was here on 05/24/17 but received a text on mychart stating that he was a no show. 05/24/17 was when Epic was down for several hours. Pt also stated that when he was here in 09/2016 Dr. Caryl Bis changed his appt to a physical and was told to come back up front to get a refund. Pt was told that they could not reverse the charges that it would be used for the next visit. Pt wants to know if his copay of $25.00 from 09/2016 will be applied to his visit for 8/8. Please advise, thank you!  Call pt @ [phone]

## 2017-06-05 NOTE — Telephone Encounter (Signed)
I sent the patient's information to charge correction to remove the no show fee due to the patient being seen ,but not officially check in due to EPIC being down.  The copay that was received will stand ,because other issues were discussed, anxiety and seizures at that visit. Therefore a co pay has to be charged.

## 2017-06-05 NOTE — Telephone Encounter (Incomplete)
I sent the patient's information to charge correction to remove the no show fee due to the patient being seen ,but not officially check in due to EPIC being down.  The copay that was received will stand ,because other issues were discussed, anxi

## 2017-06-12 ENCOUNTER — Other Ambulatory Visit: Payer: Self-pay | Admitting: Family Medicine

## 2017-10-06 ENCOUNTER — Other Ambulatory Visit: Payer: Self-pay

## 2017-10-06 ENCOUNTER — Encounter: Payer: Self-pay | Admitting: Family Medicine

## 2017-10-06 ENCOUNTER — Ambulatory Visit (INDEPENDENT_AMBULATORY_CARE_PROVIDER_SITE_OTHER): Payer: Managed Care, Other (non HMO) | Admitting: Family Medicine

## 2017-10-06 ENCOUNTER — Ambulatory Visit: Payer: Managed Care, Other (non HMO) | Admitting: Family Medicine

## 2017-10-06 VITALS — BP 120/82 | HR 52 | Temp 98.0°F | Ht 76.0 in | Wt 224.6 lb

## 2017-10-06 DIAGNOSIS — Z0000 Encounter for general adult medical examination without abnormal findings: Secondary | ICD-10-CM | POA: Diagnosis not present

## 2017-10-06 DIAGNOSIS — E663 Overweight: Secondary | ICD-10-CM

## 2017-10-06 DIAGNOSIS — Z13220 Encounter for screening for lipoid disorders: Secondary | ICD-10-CM | POA: Diagnosis not present

## 2017-10-06 DIAGNOSIS — F411 Generalized anxiety disorder: Secondary | ICD-10-CM

## 2017-10-06 DIAGNOSIS — Z0001 Encounter for general adult medical examination with abnormal findings: Secondary | ICD-10-CM | POA: Diagnosis not present

## 2017-10-06 LAB — COMPREHENSIVE METABOLIC PANEL
ALBUMIN: 4.5 g/dL (ref 3.5–5.2)
ALT: 19 U/L (ref 0–53)
AST: 22 U/L (ref 0–37)
Alkaline Phosphatase: 81 U/L (ref 39–117)
BILIRUBIN TOTAL: 0.8 mg/dL (ref 0.2–1.2)
BUN: 16 mg/dL (ref 6–23)
CALCIUM: 9.5 mg/dL (ref 8.4–10.5)
CO2: 31 mEq/L (ref 19–32)
CREATININE: 0.93 mg/dL (ref 0.40–1.50)
Chloride: 103 mEq/L (ref 96–112)
GFR: 90.65 mL/min (ref >60.00)
Glucose, Bld: 88 mg/dL (ref 70–99)
Potassium: 4.6 mEq/L (ref 3.5–5.1)
Sodium: 140 mEq/L (ref 135–145)
TOTAL PROTEIN: 7.4 g/dL (ref 6.0–8.3)

## 2017-10-06 LAB — LIPID PANEL
CHOLESTEROL: 176 mg/dL (ref 0–200)
HDL: 38.5 mg/dL — ABNORMAL LOW (ref >39.00)
LDL Cholesterol: 124 mg/dL — ABNORMAL HIGH (ref 0–99)
NonHDL: 137.31
TRIGLYCERIDES: 68 mg/dL (ref 0.0–149.0)
Total CHOL/HDL Ratio: 5
VLDL: 13.6 mg/dL (ref 0.0–40.0)

## 2017-10-06 LAB — HEMOGLOBIN A1C: Hgb A1c MFr Bld: 5.5 % (ref 4.6–6.5)

## 2017-10-06 NOTE — Progress Notes (Signed)
Benjamin Rumps, MD Phone: [phone]  Benjamin Andrews is a 51 y.o. male who presents today for physical exam.  Exercises by biking. Diet is extremely healthy.  He has been doing intermittent fasting.  Eats egg whites and avocado for lunch.  Dinner is quite healthy.  Splurges on the weekends. Declines flu shot. Colonoscopy up-to-date. Shared decision making conversation regarding prostate cancer screening.  He opted against PSA though opted for digital rectal exam. Tetanus vaccination up-to-date. Previously used dip.  Quit 1.5 years ago.  His dentist does monitor his mouth. Rare alcohol use.  No illicit drug use.  Active Ambulatory Problems    Diagnosis Date Noted  . Generalized anxiety disorder 09/30/2015  . Encounter for general adult medical examination with abnormal findings 09/30/2015  . Elevated LDL cholesterol level 12/30/2015  . Erectile dysfunction 03/16/2016  . Skin lesion of face 06/16/2016  . Obesity 06/16/2016  . Seizures ([place]) 10/14/2016  . Strain of calf muscle 05/25/2017   Resolved Ambulatory Problems    Diagnosis Date Noted  . No Resolved Ambulatory Problems   Past Medical History:  Diagnosis Date  . Allergic rhinitis   . Blood in stool   . Chickenpox   . GERD (gastroesophageal reflux disease)   . Headache   . Hemorrhoids   . High blood pressure   . Seizures ([place])   . Sleep apnea   . UTI (lower urinary tract infection)     Family History  Problem Relation Age of Onset  . Alcoholism Unknown        Grandparent  . Arthritis Unknown        Grandparent  . Stroke Unknown        Parent  . Diabetes Unknown        Grandparent  . Rectal cancer Maternal Uncle     Social History   Socioeconomic History  . Marital status: Divorced    Spouse name: Not on file  . Number of children: Not on file  . Years of education: Not on file  . Highest education level: Not on file  Social Needs  . Financial resource strain: Not on file  . Food insecurity -  worry: Not on file  . Food insecurity - inability: Not on file  . Transportation needs - medical: Not on file  . Transportation needs - non-medical: Not on file  Occupational History  . Not on file  Tobacco Use  . Smoking status: Never Smoker  . Smokeless tobacco: Former Network engineer and Sexual Activity  . Alcohol use: Yes    Alcohol/week: 0.0 oz    Comment: Infrequently  . Drug use: No  . Sexual activity: Not on file  Other Topics Concern  . Not on file  Social History Narrative  . Not on file    ROS  General:  Negative for nexplained weight loss, fever Skin: Negative for new or changing mole, sore that won't heal HEENT: Negative for trouble hearing, trouble seeing, ringing in ears, mouth sores, hoarseness, change in voice, dysphagia. CV:  Negative for chest pain, dyspnea, edema, palpitations Resp: Negative for cough, dyspnea, hemoptysis GI: Negative for nausea, vomiting, diarrhea, constipation, abdominal pain, melena, hematochezia. GU: Negative for dysuria, incontinence, urinary hesitance, hematuria, vaginal or penile discharge, polyuria, sexual difficulty, lumps in testicle or breasts MSK: Negative for muscle cramps or aches, joint pain or swelling Neuro: Negative for headaches, weakness, numbness, dizziness, passing out/fainting Psych: Negative for depression, anxiety, memory problems  Objective  Physical Exam Vitals:  10/06/17 1433 10/06/17 1508  BP: 120/90 120/82  Pulse: (!) 52   Temp: 98 F (36.7 C)   SpO2: 98%     BP Readings from Last 3 Encounters:  10/06/17 120/82  05/25/17 122/70  10/13/16 120/80   Wt Readings from Last 3 Encounters:  10/06/17 224 lb 9.6 oz (101.9 kg)  05/25/17 244 lb 9.6 oz (110.9 kg)  10/13/16 250 lb (113.4 kg)    Physical Exam  Constitutional: No distress.  HENT:  Head: Normocephalic and atraumatic.  Mouth/Throat: Oropharynx is clear and moist.  Eyes: Conjunctivae are normal. Pupils are equal, round, and reactive to  light.  Neck: Neck supple.  Cardiovascular: Regular rhythm and normal heart sounds. Bradycardia present.  Pulmonary/Chest: Effort normal and breath sounds normal.  Abdominal: Soft. Bowel sounds are normal. He exhibits no distension. There is no tenderness.  Genitourinary: Rectum normal and prostate normal.  Musculoskeletal: He exhibits no edema.  Lymphadenopathy:    He has no cervical adenopathy.  Neurological: He is alert. Gait normal.  Skin: Skin is warm and dry. He is not diaphoretic.  Psychiatric: Mood and affect normal.     Assessment/Plan:   Encounter for general adult medical examination with abnormal findings Physical exam completed.  Lab work will be obtained.  He will continue diet and exercise.  Flu shot declined.  Generalized anxiety disorder Asymptomatic.  He would like to continue the BuSpar.   Orders Placed This Encounter  Procedures  . Comp Met (CMET)  . Lipid panel  . HgB A1c    No orders of the defined types were placed in this encounter.    Benjamin Rumps, MD [place]

## 2017-10-06 NOTE — Patient Instructions (Signed)
Nice to see you.  Good job on losing weight.  Please continue diet and exercise.  We will contact you with your lab results.

## 2017-10-06 NOTE — Assessment & Plan Note (Signed)
Asymptomatic.  He would like to continue the BuSpar.

## 2017-10-06 NOTE — Assessment & Plan Note (Signed)
Physical exam completed.  Lab work will be obtained.  He will continue diet and exercise.  Flu shot declined.

## 2017-10-07 NOTE — Progress Notes (Signed)
The 10-year ASCVD risk score Mikey Bussing DC Brooke Bonito., et al., 2013) is: 4.1%   Values used to calculate the score:     Age: 51 years     Sex: Male     Is Non-Hispanic African American: No     Diabetic: No     Tobacco smoker: No     Systolic Blood Pressure: 756 mmHg     Is BP treated: No     HDL Cholesterol: 38.5 mg/dL     Total Cholesterol: 176 mg/dL

## 2017-10-24 ENCOUNTER — Other Ambulatory Visit: Payer: Self-pay | Admitting: Family Medicine

## 2017-11-02 ENCOUNTER — Telehealth: Payer: Self-pay | Admitting: Family Medicine

## 2017-11-02 NOTE — Telephone Encounter (Signed)
Copied from [place]. Topic: General - Other >> Nov 02, 2017  3:58 PM Cecelia Byars, NT wrote: Reason for CRM: [employer] home delivery called and they are out of the  buspirone and need an alternative to this medication for the patient please call  [phone] or fax [phone] with information

## 2017-11-02 NOTE — Telephone Encounter (Signed)
Please advise 

## 2017-11-02 NOTE — Telephone Encounter (Signed)
Please see if the patient would be willing to try something like Lexapro.  This is in the same class of medications that the Zoloft is in.  Thanks.

## 2017-11-03 NOTE — Telephone Encounter (Signed)
Left message to return call, ok for pec to speak with patient to ask if it would be ok to switch to lexapro due to buspar being on back order.

## 2017-11-14 ENCOUNTER — Telehealth: Payer: Self-pay | Admitting: Family Medicine

## 2017-11-14 NOTE — Telephone Encounter (Signed)
Sent mychart message, unable to reach patient

## 2017-11-14 NOTE — Telephone Encounter (Signed)
Copied from [place] [id number]. Topic: Quick Communication - See Telephone Encounter >> Nov 03, 2017  9:17 AM Juanda Chance, CMA wrote: CRM for notification. See Telephone encounter for: Left message to return call, ok for pec to speak with patient to ask if it would be ok to switch to lexapro due to buspar being on back order.  11/03/17. >> Nov 14, 2017  4:42 PM Cleaster Corin, NT wrote: Pt. Would like to know how much that would cost. Would it be more or less from what he was taking. Pt. Also stated that he has been doing fine without the med. Wants to know what the Dr. suggests him to do.  Pt. Can be reached at [phone]

## 2017-11-15 ENCOUNTER — Telehealth: Payer: Self-pay

## 2017-11-15 NOTE — Telephone Encounter (Signed)
If he has been doing fine without the medication he can stay off of it. If he develops recurrent symptoms we could consider an alternative medication.

## 2017-11-15 NOTE — Telephone Encounter (Signed)
Copied from [place] [phone]. Topic: Quick Communication - See Telephone Encounter >> Nov 03, 2017  9:17 AM Juanda Chance, CMA wrote: CRM for notification. See Telephone encounter for: Left message to return call, ok for pec to speak with patient to ask if it would be ok to switch to lexapro due to buspar being on back order.  11/03/17. >> Nov 14, 2017  4:42 PM Cleaster Corin, NT wrote: Pt. Would like to know how much that would cost. Would it be more or less from what he was taking. Pt. Also stated that he has been doing fine without the med. Wants to know what the Dr. suggests him to do.  Pt. Can be reached at [phone]

## 2017-11-15 NOTE — Telephone Encounter (Signed)
Left message to return call, also sent mychart message.

## 2017-11-15 NOTE — Telephone Encounter (Signed)
Please advise 

## 2017-11-26 ENCOUNTER — Encounter: Payer: Self-pay | Admitting: Family Medicine

## 2017-11-27 MED ORDER — BUSPIRONE HCL 10 MG PO TABS: 10.0000 mg | ORAL_TABLET | Freq: Two times a day (BID) | ORAL | 3 refills | Status: DC

## 2018-10-08 ENCOUNTER — Encounter: Payer: Managed Care, Other (non HMO) | Admitting: Family Medicine

## 2018-11-09 ENCOUNTER — Other Ambulatory Visit: Payer: Self-pay

## 2018-11-09 ENCOUNTER — Ambulatory Visit (INDEPENDENT_AMBULATORY_CARE_PROVIDER_SITE_OTHER): Payer: No Typology Code available for payment source | Admitting: Family Medicine

## 2018-11-09 ENCOUNTER — Encounter: Payer: Self-pay | Admitting: Family Medicine

## 2018-11-09 VITALS — BP 120/80 | HR 54 | Temp 98.1°F | Resp 17 | Ht 75.0 in | Wt 237.4 lb

## 2018-11-09 DIAGNOSIS — Z13220 Encounter for screening for lipoid disorders: Secondary | ICD-10-CM | POA: Diagnosis not present

## 2018-11-09 DIAGNOSIS — E663 Overweight: Secondary | ICD-10-CM | POA: Diagnosis not present

## 2018-11-09 DIAGNOSIS — D696 Thrombocytopenia, unspecified: Secondary | ICD-10-CM

## 2018-11-09 DIAGNOSIS — Z0000 Encounter for general adult medical examination without abnormal findings: Secondary | ICD-10-CM | POA: Diagnosis not present

## 2018-11-09 MED ORDER — BUSPIRONE HCL 10 MG PO TABS: 10.0000 mg | ORAL_TABLET | Freq: Two times a day (BID) | ORAL | 3 refills | Status: DC

## 2018-11-09 NOTE — Patient Instructions (Signed)
Nice to see you. Please continue with diet and exercise. We will check lab work today and contact you with the results. 

## 2018-11-09 NOTE — Assessment & Plan Note (Signed)
Physical exam completed.  Encouraged continued diet and exercise.  Encouraged him to continue to follow with urology regarding his erectile dysfunction.  He will check with his insurance regarding Shingrix and get this at the pharmacy if he would like to proceed with that.  Lab work as outlined below.

## 2018-11-09 NOTE — Progress Notes (Signed)
[name], MD Phone: [phone]  Benjamin Andrews is a 53 y.o. male who presents today for CPE.  Exercises by riding his bike 3 days a week. Diet is healthy.  He avoids breads.  No soda or sweet tea. Colonoscopy is up-to-date.  5-year recall. No family history of prostate cancer.  Discussed prostate cancer screening with benefits and risks and limitations of PSA testing.  Patient declined prostate cancer screening. Tetanus vaccination and flu vaccination up-to-date.  He is due for Shingrix. No tobacco use or illicit drug use.  Rare alcohol use. He sees a Pharmacist, community twice yearly.  He has no vision issues and thus does not see an ophthalmologist. Patient does follow with urology for erectile dysfunction.  He reports prior PSA testing and digital rectal exam with them.  Active Ambulatory Problems    Diagnosis Date Noted  . Generalized anxiety disorder 09/30/2015  . Routine general medical examination at a health care facility 09/30/2015  . Elevated LDL cholesterol level 12/30/2015  . Erectile dysfunction 03/16/2016  . Skin lesion of face 06/16/2016  . Obesity 06/16/2016  . Seizures ([place]) 10/14/2016  . Strain of calf muscle 05/25/2017   Resolved Ambulatory Problems    Diagnosis Date Noted  . No Resolved Ambulatory Problems   Past Medical History:  Diagnosis Date  . Allergic rhinitis   . Blood in stool   . Chickenpox   . GERD (gastroesophageal reflux disease)   . Headache   . Hemorrhoids   . High blood pressure   . Sleep apnea   . UTI (lower urinary tract infection)     Family History  Problem Relation Age of Onset  . Alcoholism Unknown        Grandparent  . Arthritis Unknown        Grandparent  . Stroke Unknown        Parent  . Diabetes Unknown        Grandparent  . Rectal cancer Maternal Uncle     Social History   Socioeconomic History  . Marital status: Divorced    Spouse name: Not on file  . Number of children: Not on file  . Years of education: Not on  file  . Highest education level: Not on file  Occupational History  . Not on file  Social Needs  . Financial resource strain: Not on file  . Food insecurity:    Worry: Not on file    Inability: Not on file  . Transportation needs:    Medical: Not on file    Non-medical: Not on file  Tobacco Use  . Smoking status: Never Smoker  . Smokeless tobacco: Former Network engineer and Sexual Activity  . Alcohol use: Yes    Alcohol/week: 0.0 standard drinks    Comment: Infrequently  . Drug use: No  . Sexual activity: Not on file  Lifestyle  . Physical activity:    Days per week: Not on file    Minutes per session: Not on file  . Stress: Not on file  Relationships  . Social connections:    Talks on phone: Not on file    Gets together: Not on file    Attends religious service: Not on file    Active member of club or organization: Not on file    Attends meetings of clubs or organizations: Not on file    Relationship status: Not on file  . Intimate partner violence:    Fear of current or ex partner: Not on file  Emotionally abused: Not on file    Physically abused: Not on file    Forced sexual activity: Not on file  Other Topics Concern  . Not on file  Social History Narrative  . Not on file    ROS  General:  Negative for nexplained weight loss, fever Skin: Negative for new or changing mole, sore that won't heal HEENT: Negative for trouble hearing, trouble seeing, ringing in ears, mouth sores, hoarseness, change in voice, dysphagia. CV:  Negative for chest pain, dyspnea, edema, palpitations Resp: Negative for cough, dyspnea, hemoptysis GI: Negative for nausea, vomiting, diarrhea, constipation, abdominal pain, melena, hematochezia. GU: Negative for dysuria, incontinence, urinary hesitance, hematuria, vaginal or penile discharge, polyuria, sexual difficulty, lumps in testicle or breasts MSK: Negative for muscle cramps or aches, joint pain or swelling Neuro: Negative for  headaches, weakness, numbness, dizziness, passing out/fainting Psych: Negative for depression, anxiety, memory problems  Objective  Physical Exam Vitals:   11/09/18 1533  BP: 120/80  Pulse: (!) 54  Resp: 17  Temp: 98.1 F (36.7 C)  SpO2: 94%    BP Readings from Last 3 Encounters:  11/09/18 120/80  10/06/17 120/82  05/25/17 122/70   Wt Readings from Last 3 Encounters:  11/09/18 237 lb 6 oz (107.7 kg)  10/06/17 224 lb 9.6 oz (101.9 kg)  05/25/17 244 lb 9.6 oz (110.9 kg)    Physical Exam Constitutional:      General: He is not in acute distress.    Appearance: He is not diaphoretic.  HENT:     Head: Normocephalic and atraumatic.     Mouth/Throat:     Mouth: Mucous membranes are moist.     Pharynx: Oropharynx is clear.  Eyes:     Conjunctiva/sclera: Conjunctivae normal.     Pupils: Pupils are equal, round, and reactive to light.  Cardiovascular:     Rate and Rhythm: Normal rate and regular rhythm.     Heart sounds: Normal heart sounds.  Pulmonary:     Effort: Pulmonary effort is normal.     Breath sounds: Normal breath sounds.  Abdominal:     General: Bowel sounds are normal. There is no distension.     Palpations: Abdomen is soft.     Tenderness: There is no abdominal tenderness. There is no guarding or rebound.  Skin:    General: Skin is warm and dry.  Neurological:     Mental Status: He is alert.  Psychiatric:        Mood and Affect: Mood normal.      Assessment/Plan:   Routine general medical examination at a health care facility Physical exam completed.  Encouraged continued diet and exercise.  Encouraged him to continue to follow with urology regarding his erectile dysfunction.  He will check with his insurance regarding Shingrix and get this at the pharmacy if he would like to proceed with that.  Lab work as outlined below.   Orders Placed This Encounter  Procedures  . HgB A1c  . Comp Met (CMET)  . Lipid panel  . CBC    Meds ordered this  encounter  Medications  . busPIRone (BUSPAR) 10 MG tablet    Sig: Take 1 tablet (10 mg total) by mouth 2 (two) times daily.    Dispense:  180 tablet    Refill:  [place], MD [place]

## 2018-11-10 ENCOUNTER — Other Ambulatory Visit: Payer: Self-pay | Admitting: Family Medicine

## 2018-11-10 LAB — CBC
HCT: 46.6 % (ref 38.5–50.0)
HEMOGLOBIN: 15.8 g/dL (ref 13.2–17.1)
MCH: 30.4 pg (ref 27.0–33.0)
MCHC: 33.9 g/dL (ref 32.0–36.0)
MCV: 89.8 fL (ref 80.0–100.0)
MPV: 11.1 fL (ref 7.5–12.5)
PLATELETS: 156 10*3/uL (ref 140–400)
RBC: 5.19 10*6/uL (ref 4.20–5.80)
RDW: 12.3 % (ref 11.0–15.0)
WBC: 7.7 10*3/uL (ref 3.8–10.8)

## 2018-11-10 LAB — LIPID PANEL
CHOLESTEROL: 222 mg/dL — AB (ref <200)
HDL: 34 mg/dL — AB (ref >40)
LDL Cholesterol (Calc): 158 mg/dL (calc) — ABNORMAL HIGH
Non-HDL Cholesterol (Calc): 188 mg/dL (calc) — ABNORMAL HIGH (ref <130)
Total CHOL/HDL Ratio: 6.5 (calc) — ABNORMAL HIGH (ref <5.0)
Triglycerides: 162 mg/dL — ABNORMAL HIGH (ref <150)

## 2018-11-10 LAB — COMPREHENSIVE METABOLIC PANEL
AG Ratio: 1.8 (calc) (ref 1.0–2.5)
ALBUMIN MSPROF: 4.6 g/dL (ref 3.6–5.1)
ALKALINE PHOSPHATASE (APISO): 67 U/L (ref 40–115)
ALT: 22 U/L (ref 9–46)
AST: 27 U/L (ref 10–35)
BILIRUBIN TOTAL: 0.5 mg/dL (ref 0.2–1.2)
BUN/Creatinine Ratio: 21 (calc) (ref 6–22)
BUN: 26 mg/dL — ABNORMAL HIGH (ref 7–25)
CALCIUM: 10.4 mg/dL — AB (ref 8.6–10.3)
CO2: 29 mmol/L (ref 20–32)
Chloride: 102 mmol/L (ref 98–110)
Creat: 1.25 mg/dL (ref 0.70–1.33)
GLOBULIN: 2.5 g/dL (ref 1.9–3.7)
Glucose, Bld: 90 mg/dL (ref 65–99)
POTASSIUM: 4.9 mmol/L (ref 3.5–5.3)
Sodium: 139 mmol/L (ref 135–146)
Total Protein: 7.1 g/dL (ref 6.1–8.1)

## 2018-11-10 LAB — HEMOGLOBIN A1C
Hgb A1c MFr Bld: 5.3 % of total Hgb (ref <5.7)
Mean Plasma Glucose: 105 (calc)
eAG (mmol/L): 5.8 (calc)

## 2019-04-03 ENCOUNTER — Telehealth: Payer: Self-pay | Admitting: Neurology

## 2019-04-03 NOTE — Telephone Encounter (Signed)
Pt gave consent for VV on the phone/ Pt understands that although there may be some limitations with this type of visit, we will take all precautions to reduce any security or privacy concerns.  Pt understands that this will be treated like an in office visit and we will file with pt's insurance, and there may be a patient responsible charge related to this service. Sent e-mail to [email or web address]

## 2019-04-09 ENCOUNTER — Encounter: Payer: Self-pay | Admitting: *Deleted

## 2019-04-11 ENCOUNTER — Other Ambulatory Visit: Payer: Self-pay

## 2019-04-11 ENCOUNTER — Encounter: Payer: Self-pay | Admitting: Neurology

## 2019-04-11 ENCOUNTER — Ambulatory Visit (INDEPENDENT_AMBULATORY_CARE_PROVIDER_SITE_OTHER): Payer: No Typology Code available for payment source | Admitting: Neurology

## 2019-04-11 DIAGNOSIS — R569 Unspecified convulsions: Secondary | ICD-10-CM

## 2019-04-11 DIAGNOSIS — F411 Generalized anxiety disorder: Secondary | ICD-10-CM

## 2019-04-11 MED ORDER — OXCARBAZEPINE 300 MG PO TABS: 300.0000 mg | ORAL_TABLET | Freq: Two times a day (BID) | ORAL | 4 refills | Status: DC

## 2019-04-11 NOTE — Progress Notes (Signed)
PATIENT: Benjamin Andrews DOB: Jun 28, 1966  Virtual Visit via video  I connected with Jessica Priest on 04/11/19 at  by video and verified that I am speaking with the correct person using two identifiers.   I discussed the limitations, risks, security and privacy concerns of performing an evaluation and management service by video and the availability of in person appointments. I also discussed with the patient that there may be a patient responsible charge related to this service. The patient expressed understanding and agreed to proceed.  HISTORICAL  Benjamin Andrews is a 53 year old male, seen in request by his primary care physician Dr. Caryl Bis, [facility]iss for evaluation of seizure  I have reviewed and summarized the referring note from the referring physician.  He had a history of anxiety, taking Buspar 10mg  bid, anxiety, also history of nocturnal seizure, taking Trileptal 300mg  bid.used to have sleep apnea, he lost 20 pounds recently, is no longer using CPAP machine  I was able to review his previous neurologist's note, Dr. Alfonso Patten from [facility], most recent office note was on July 11, 2018, he has retired.  Patient reported a history of nocturnal seizure since 1998, there was a delayed diagnosis, he presented with right shoulder dislocation 3 times after woke up from overnight sleep, eventually had right shoulder surgery, shortly following his right shoulder surgery, he had a witnessed nocturnal generalized tonic-clonic seizure in 2000  Per patient, EEG, and MRI showed no significant abnormality, he was treated with a different antiepileptic medication, could not tolerated, then he was treated with Trileptal 150 mg 3 times a day, complains of daytime sleepiness, he has been on stable dose Trileptal 150mg  2 times a day for a long time, in 2015, in attempt to wean off Trileptal, he suffered another recurrent nocturnal seizure, woke up with whole-body muscle achy pain  He works as a  Furniture conservator/restorer, does not want to have MRI of the brain, or EEG at this point  His maternal aunt, and maternal great grandmother suffered epilepsy,    Observations/Objective: I have reviewed problem lists, medications, allergies. Awake, alert, oriented to history taking, and casual conversation, moving 4 extremities without difficulty, ambulate with a steady gait Assessment and Plan: Epilepsy  Last occurrence was in 2015  Keep Trileptal 150 mg twice a day  He does not want to have MRI of the brain, EEG at this point  Follow Up Instructions:  Return to clinic with nurse practitioner Sarah in 6 months    I discussed the assessment and treatment plan with the patient. The patient was provided an opportunity to ask questions and all were answered. The patient agreed with the plan and demonstrated an understanding of the instructions.   The patient was advised to call back or seek an in-person evaluation if the symptoms worsen or if the condition fails to improve as anticipated.  I provided 30 minutes of non-face-to-face time during this encounter.  REVIEW OF SYSTEMS: Full 14 system review of systems performed and notable only for as above All other review of systems were negative.  ALLERGIES: No Known Allergies  HOME MEDICATIONS: Current Outpatient Medications  Medication Sig Dispense Refill  . busPIRone (BUSPAR) 10 MG tablet Take 1 tablet (10 mg total) by mouth 2 (two) times daily. 180 tablet 3  . Multiple Vitamin (MULTIVITAMIN) tablet Take 1 tablet by mouth daily.    . Omega-3 Fatty Acids (FISH OIL CONCENTRATE PO) Take by mouth.    . Oxcarbazepine (TRILEPTAL) 300 MG tablet Take 300 mg  by mouth 2 (two) times daily.     No current facility-administered medications for this visit.     PAST MEDICAL HISTORY: Past Medical History:  Diagnosis Date  . Allergic rhinitis   . Blood in stool   . Chickenpox   . Headache   . Hemorrhoids   . High blood pressure   . Seizures ([place])   .  Sleep apnea   . UTI (lower urinary tract infection)     PAST SURGICAL HISTORY: Past Surgical History:  Procedure Laterality Date  . broken nose    . COLONOSCOPY WITH PROPOFOL N/A 12/04/2015   Procedure: COLONOSCOPY WITH PROPOFOL;  Surgeon: Manya Silvas, MD;  Location: [facility] ENDOSCOPY;  Service: Endoscopy;  Laterality: N/A;  . SHOULDER SURGERY      FAMILY HISTORY: Family History  Problem Relation Age of Onset  . Alcoholism Other        Grandparent  . Arthritis Other        Grandparent  . Stroke Other        Parent  . Diabetes Other        Grandparent  . Rectal cancer Maternal Uncle   . Cancer Mother        unsure - started in back  . Healthy Father     SOCIAL HISTORY:   Social History   Socioeconomic History  . Marital status: Divorced    Spouse name: Not on file  . Number of children: 1  . Years of education: some college  . Highest education level: Not on file  Occupational History  . Occupation: Furniture conservator/restorer  Social Needs  . Financial resource strain: Not on file  . Food insecurity    Worry: Not on file    Inability: Not on file  . Transportation needs    Medical: Not on file    Non-medical: Not on file  Tobacco Use  . Smoking status: Never Smoker  . Smokeless tobacco: Former Network engineer and Sexual Activity  . Alcohol use: Yes    Alcohol/week: 0.0 standard drinks    Comment: Infrequently  . Drug use: No  . Sexual activity: Not on file  Lifestyle  . Physical activity    Days per week: Not on file    Minutes per session: Not on file  . Stress: Not on file  Relationships  . Social Herbalist on phone: Not on file    Gets together: Not on file    Attends religious service: Not on file    Active member of club or organization: Not on file    Attends meetings of clubs or organizations: Not on file    Relationship status: Not on file  . Intimate partner violence    Fear of current or ex partner: Not on file    Emotionally abused: Not  on file    Physically abused: Not on file    Forced sexual activity: Not on file  Other Topics Concern  . Not on file  Social History Narrative   Lives at home with his significant other.   Right-handed.   Two cups caffeine per day.    Marcial Pacas, M.D. Ph.D.  [facility] [address], [place], [place] [address] Ph: [phone] Fax: [phone]  CC: Leone Haven, MD

## 2019-11-08 ENCOUNTER — Other Ambulatory Visit: Payer: Self-pay | Admitting: Family Medicine

## 2019-11-11 ENCOUNTER — Encounter: Payer: No Typology Code available for payment source | Admitting: Family Medicine

## 2019-11-11 NOTE — Telephone Encounter (Signed)
Patient had scheduled appointment this week was canceled due to provider out of office next appt scheduled 12/13/19 ok to fill Buspar for 30 days?

## 2019-11-12 ENCOUNTER — Other Ambulatory Visit: Payer: Self-pay

## 2019-11-13 MED ORDER — BUSPIRONE HCL 10 MG PO TABS: 10.0000 mg | ORAL_TABLET | Freq: Two times a day (BID) | ORAL | 0 refills | Status: DC

## 2019-11-13 NOTE — Telephone Encounter (Signed)
rx sent in for buspar #180 with no refills.

## 2019-12-13 ENCOUNTER — Other Ambulatory Visit: Payer: Self-pay

## 2019-12-13 ENCOUNTER — Ambulatory Visit (INDEPENDENT_AMBULATORY_CARE_PROVIDER_SITE_OTHER): Payer: No Typology Code available for payment source

## 2019-12-13 ENCOUNTER — Encounter: Payer: Self-pay | Admitting: Family Medicine

## 2019-12-13 ENCOUNTER — Ambulatory Visit (INDEPENDENT_AMBULATORY_CARE_PROVIDER_SITE_OTHER): Payer: No Typology Code available for payment source | Admitting: Family Medicine

## 2019-12-13 VITALS — BP 130/80 | HR 56 | Temp 96.9°F | Ht 75.0 in | Wt 230.4 lb

## 2019-12-13 DIAGNOSIS — Z0001 Encounter for general adult medical examination with abnormal findings: Secondary | ICD-10-CM

## 2019-12-13 DIAGNOSIS — Z0000 Encounter for general adult medical examination without abnormal findings: Secondary | ICD-10-CM

## 2019-12-13 DIAGNOSIS — E663 Overweight: Secondary | ICD-10-CM

## 2019-12-13 DIAGNOSIS — M5412 Radiculopathy, cervical region: Secondary | ICD-10-CM | POA: Insufficient documentation

## 2019-12-13 DIAGNOSIS — Z125 Encounter for screening for malignant neoplasm of prostate: Secondary | ICD-10-CM

## 2019-12-13 DIAGNOSIS — E7800 Pure hypercholesterolemia, unspecified: Secondary | ICD-10-CM | POA: Diagnosis not present

## 2019-12-13 IMAGING — DX DG CERVICAL SPINE COMPLETE 4+V
6 series · 6 of 6 positions shown · non-contrast
Comparison: None.

CLINICAL DATA: 53-year-old male with cervical radiculopathy.

EXAM:
CERVICAL SPINE - COMPLETE 4+ VIEW

[cervical spine open mouth ap]
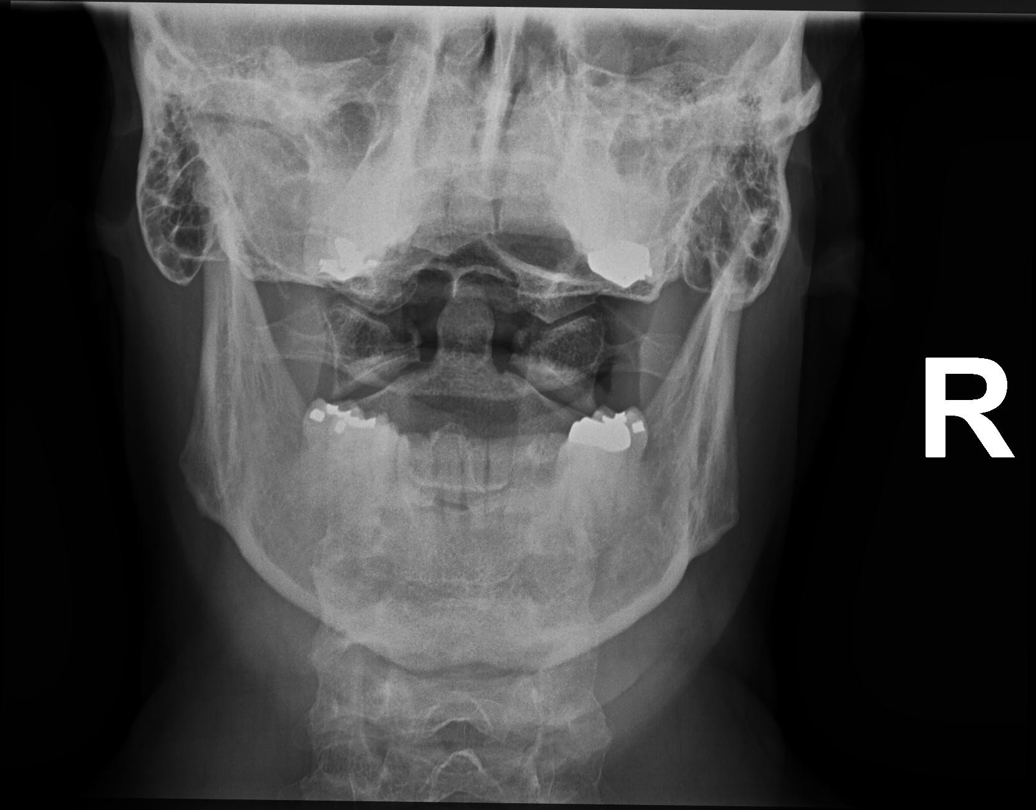

[cervical spine oblique (1 of 2)]
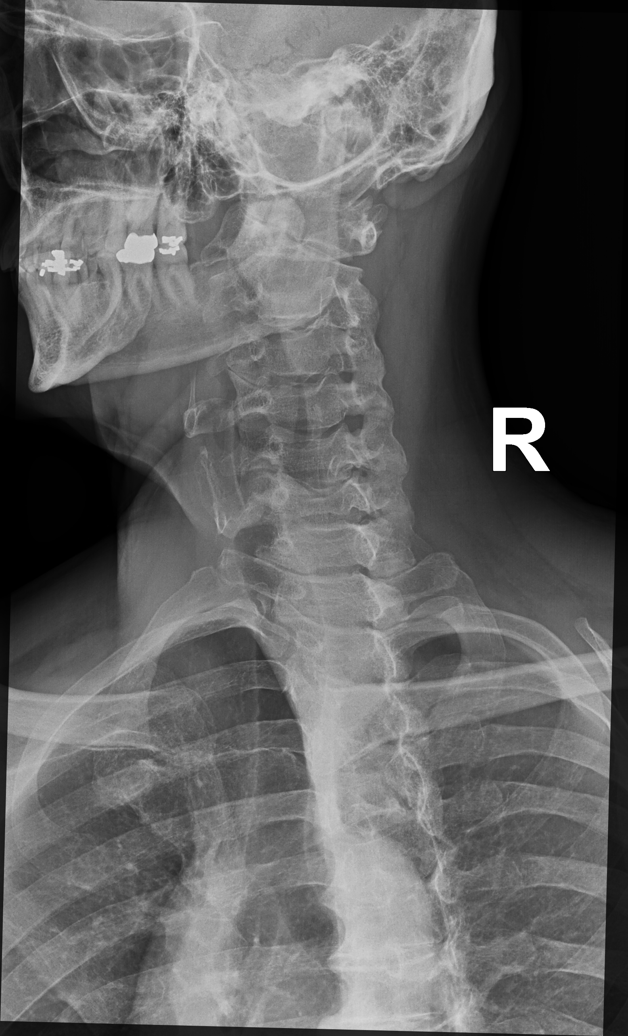

[cervical spine oblique (2 of 2)]
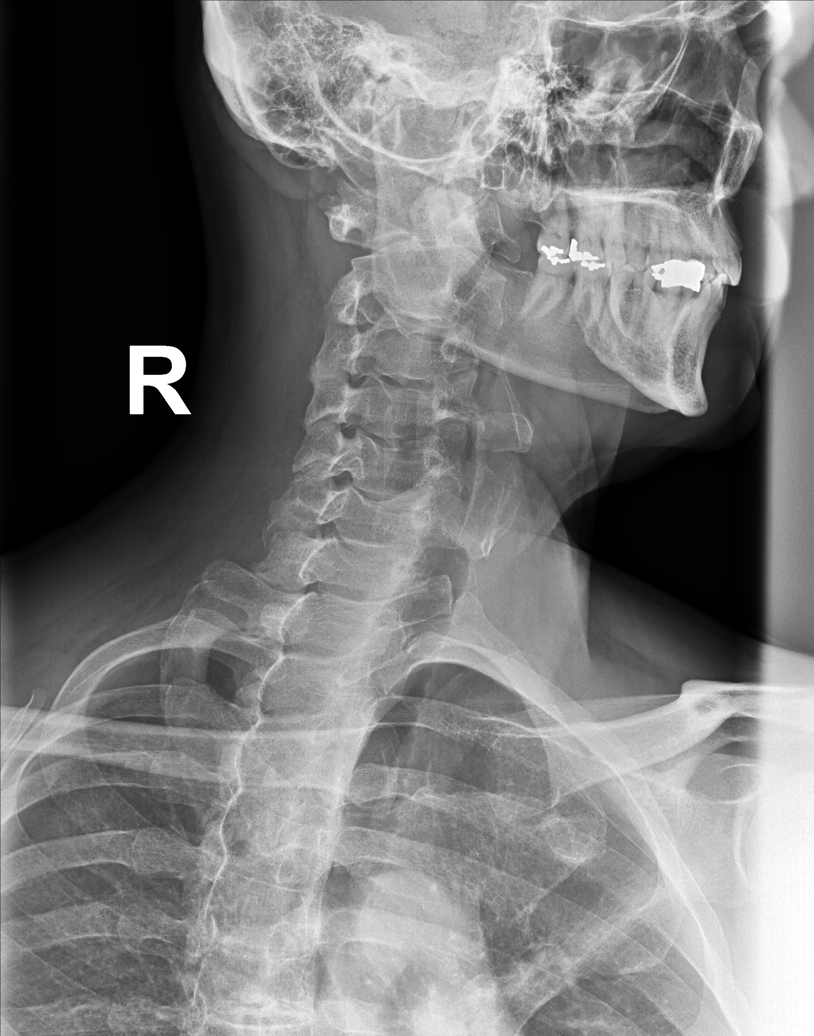

[cervical spine lat]
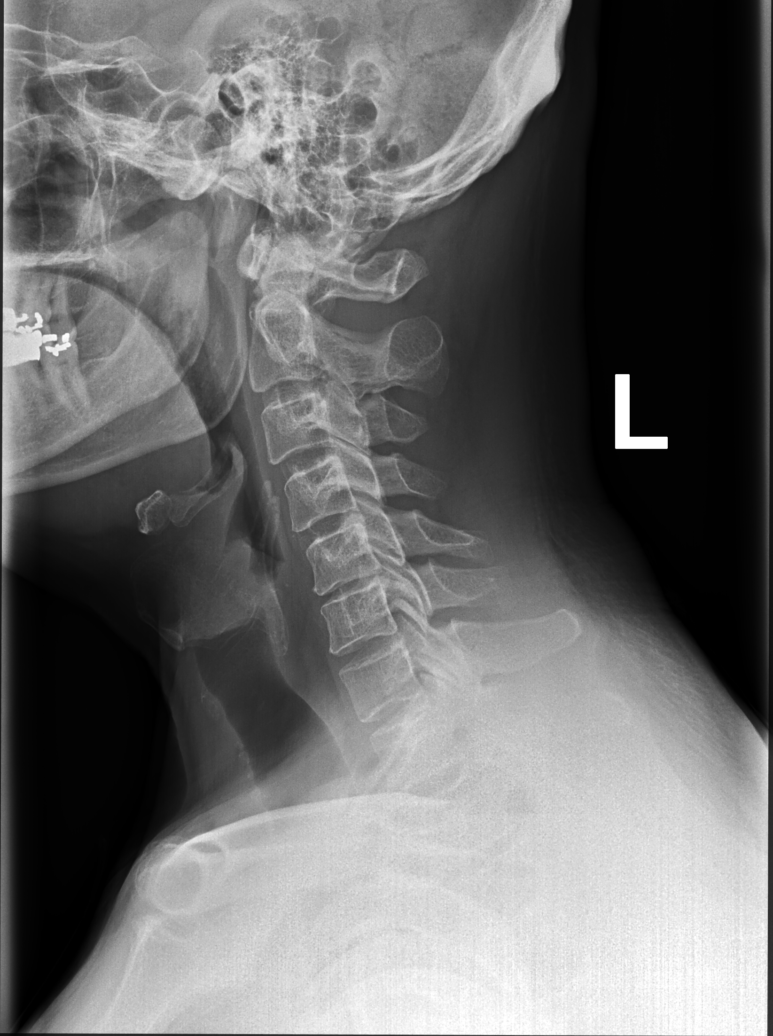

[swimmers lat]
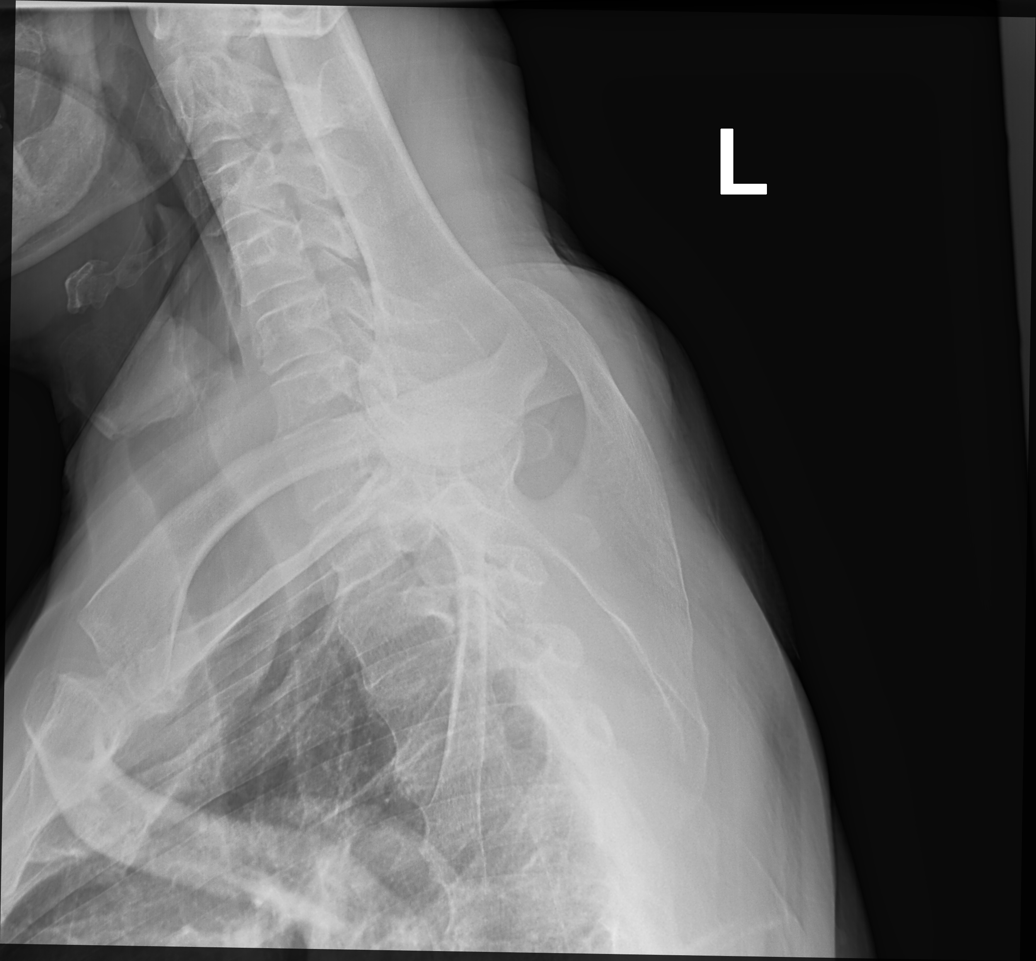

[cervical spine ap]
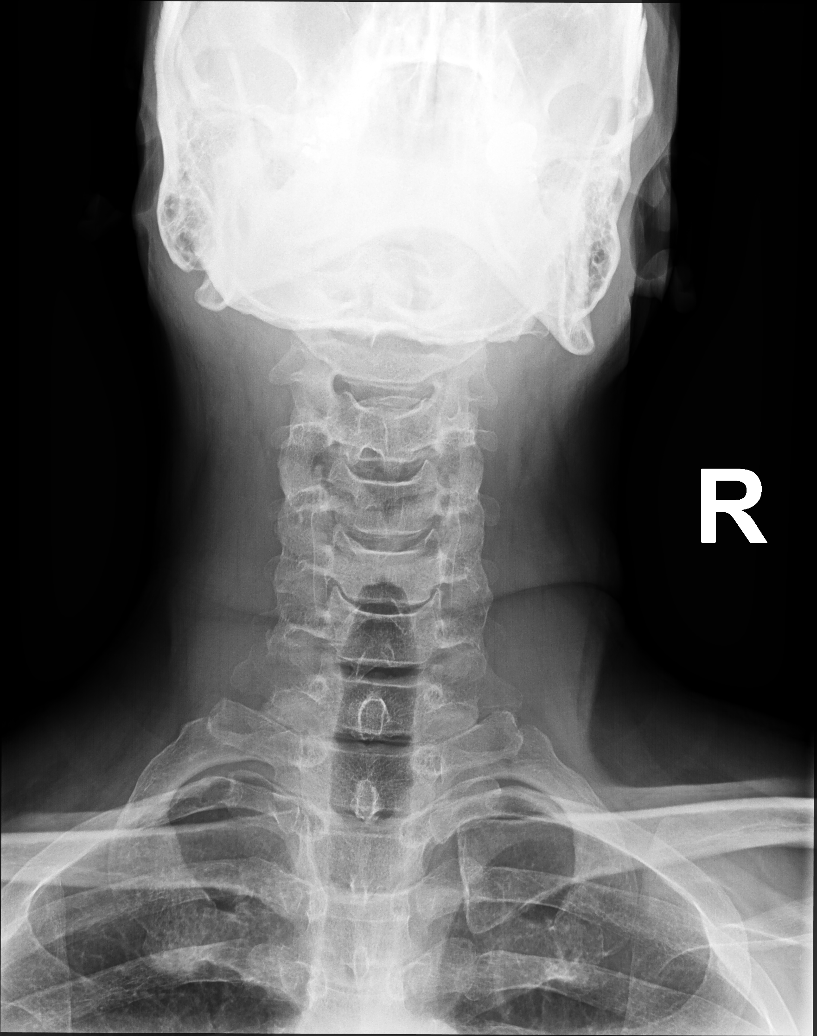

[6 of 6 positions shown; findings below may reference images not displayed]

FINDINGS: Apparent fragmentation of the medial aspects of bilateral C1 lateral
masses, likely chronic. An acute or subacute fracture or other
pathologic processes are not excluded. Clinical correlation is
recommended. Further evaluation with CT or MRI is advised. No other
acute fracture identified. Slight straightening of normal cervical
lordosis may be positional or due to muscle spasm. The visualized
posterior elements and odontoid appear intact. There is anatomic
alignment of the lateral masses of C1 and C2. The soft tissues are
unremarkable.
IMPRESSION: Fragmentation of the medial aspects of bilateral C1 lateral masses,
likely chronic. Clinical correlation and further evaluation with CT
or MRI is advised.

## 2019-12-13 MED ORDER — PREDNISONE 20 MG PO TABS: 40.0000 mg | ORAL_TABLET | Freq: Every day | ORAL | 0 refills | Status: DC

## 2019-12-13 NOTE — Assessment & Plan Note (Signed)
Symptoms consistent with cervical radiculopathy.  Will obtain an x-ray today.  Trial of prednisone.  Discussed potential side effects of difficulty sleeping, increased appetite, and agitation related to the prednisone.  Return precautions given in AVS.

## 2019-12-13 NOTE — Patient Instructions (Signed)
Nice to see you. Get lab work today and contact you with results. We are going to place you on on prednisone for your neck.  If your pain gets significantly worse please be evaluated.  If you develop persistent numbness, weakness, or any new or change in symptoms please be evaluated immediately.

## 2019-12-13 NOTE — Assessment & Plan Note (Signed)
Physical exam completed.  Encouraged continued healthy diet.  Discussed getting back to exercise once we sort his neck out.  Discussed shingles vaccine and COVID-19 vaccine.  Advised that he is able to get the shingles vaccine currently and could get this here or at the pharmacy.  Discussed he could check with his insurance regarding cost.  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  Discussed that he cannot have any other vaccines within 14 days of a COVID-19 vaccine and thus we will defer the shingles vaccine at this time so that he is able to get the COVID-19 vaccine when it becomes available to him.  Lab work as outlined below.

## 2019-12-13 NOTE — Progress Notes (Signed)
[name], MD Phone: [phone]  Benjamin Andrews is a 54 y.o. male who presents today for CPE.  Diet: Healthy, no sweet tea or soda Exercise: Not able to exercise much given his neck Colonoscopy: Up-to-date 12/04/2015 with 5-year recall Prostate cancer screening: Opts for PSA Sexually active: Yes with single partner Vaccines-   Flu: Up-to-date  Tetanus: Up-to-date  Shingles: Due  COVID19: Due HIV screening: Up-to-date Tobacco use: No Alcohol use: Once a year Illicit Drug use: No Dentist: Yes Ophthalmology: Yes  Cervical radiculopathy: Patient notes for the last week or so he has had left-sided neck pain radiating down the back of his left arm with associated numbness.  Some tingling bilateral index and middle fingers at times.  No weakness.  He saw a chiropractor and notes they felt it was related to his job.  He works as a Furniture conservator/restorer.   Active Ambulatory Problems    Diagnosis Date Noted  . Generalized anxiety disorder 09/30/2015  . Encounter for general adult medical examination with abnormal findings 09/30/2015  . Elevated LDL cholesterol level 12/30/2015  . Erectile dysfunction 03/16/2016  . Skin lesion of face 06/16/2016  . Obesity 06/16/2016  . Seizures ([place]) 10/14/2016  . Strain of calf muscle 05/25/2017  . Cervical radiculopathy 12/13/2019   Resolved Ambulatory Problems    Diagnosis Date Noted  . No Resolved Ambulatory Problems   Past Medical History:  Diagnosis Date  . Allergic rhinitis   . Blood in stool   . Chickenpox   . Headache   . Hemorrhoids   . High blood pressure   . Sleep apnea   . UTI (lower urinary tract infection)     Family History  Problem Relation Age of Onset  . Alcoholism Other        Grandparent  . Arthritis Other        Grandparent  . Stroke Other        Parent  . Diabetes Other        Grandparent  . Rectal cancer Maternal Uncle   . Cancer Mother        unsure - started in back  . Healthy Father     Social History     Socioeconomic History  . Marital status: Significant Other    Spouse name: Not on file  . Number of children: 1  . Years of education: some college  . Highest education level: Not on file  Occupational History  . Occupation: Furniture conservator/restorer  Tobacco Use  . Smoking status: Never Smoker  . Smokeless tobacco: Former Network engineer and Sexual Activity  . Alcohol use: Yes    Alcohol/week: 0.0 standard drinks    Comment: Infrequently  . Drug use: No  . Sexual activity: Not on file  Other Topics Concern  . Not on file  Social History Narrative   Lives at home with his significant other.   Right-handed.   Two cups caffeine per day.   Social Determinants of Health   Financial Resource Strain:   . Difficulty of Paying Living Expenses: Not on file  Food Insecurity:   . Worried About Charity fundraiser in the Last Year: Not on file  . Ran Out of Food in the Last Year: Not on file  Transportation Needs:   . Lack of Transportation (Medical): Not on file  . Lack of Transportation (Non-Medical): Not on file  Physical Activity:   . Days of Exercise per Week: Not on file  . Minutes of Exercise  per Session: Not on file  Stress:   . Feeling of Stress : Not on file  Social Connections:   . Frequency of Communication with Friends and Family: Not on file  . Frequency of Social Gatherings with Friends and Family: Not on file  . Attends Religious Services: Not on file  . Active Member of Clubs or Organizations: Not on file  . Attends Archivist Meetings: Not on file  . Marital Status: Not on file  Intimate Partner Violence:   . Fear of Current or Ex-Partner: Not on file  . Emotionally Abused: Not on file  . Physically Abused: Not on file  . Sexually Abused: Not on file    ROS  General:  Negative for nexplained weight loss, fever Skin: Negative for new or changing mole, sore that won't heal HEENT: Negative for trouble hearing, trouble seeing, ringing in ears, mouth sores,  hoarseness, change in voice, dysphagia. CV:  Negative for chest pain, dyspnea, edema, palpitations Resp: Negative for cough, dyspnea, hemoptysis GI: Negative for nausea, vomiting, diarrhea, constipation, abdominal pain, melena, hematochezia. GU: Negative for dysuria, incontinence, urinary hesitance, hematuria, vaginal or penile discharge, polyuria, sexual difficulty, lumps in testicle or breasts MSK: Negative for muscle cramps or aches, joint pain or swelling Neuro: Positive for numbness, negative for headaches, weakness, dizziness, passing out/fainting Psych: Negative for depression, anxiety, memory problems  Objective  Physical Exam Vitals:   12/13/19 1614  BP: 130/80  Pulse: (!) 56  Temp: (!) 96.9 F (36.1 C)  SpO2: 99%    BP Readings from Last 3 Encounters:  12/13/19 130/80  11/09/18 120/80  10/06/17 120/82   Wt Readings from Last 3 Encounters:  12/13/19 230 lb 6.4 oz (104.5 kg)  11/09/18 237 lb 6 oz (107.7 kg)  10/06/17 224 lb 9.6 oz (101.9 kg)    Physical Exam Constitutional:      General: He is not in acute distress.    Appearance: He is not diaphoretic.  HENT:     Head: Normocephalic and atraumatic.  Eyes:     Conjunctiva/sclera: Conjunctivae normal.     Pupils: Pupils are equal, round, and reactive to light.  Cardiovascular:     Rate and Rhythm: Normal rate and regular rhythm.     Heart sounds: Normal heart sounds.  Pulmonary:     Effort: Pulmonary effort is normal.     Breath sounds: Normal breath sounds.  Abdominal:     General: Bowel sounds are normal. There is no distension.     Palpations: Abdomen is soft.     Tenderness: There is no abdominal tenderness. There is no guarding or rebound.  Musculoskeletal:     Right lower leg: No edema.     Left lower leg: No edema.     Comments: No midline cervical spine tenderness, no midline cervical spine step-off, slight muscular soreness and tenderness on palpation on the left aspect of his neck and trapezius    Skin:    General: Skin is warm and dry.  Neurological:     Mental Status: He is alert.     Comments: 5/5 strength in bilateral biceps, triceps, grip, quads, hamstrings, plantar and dorsiflexion, sensation to light touch intact in bilateral UE and LE, normal gait, 2+ patellar, brachioradialis, and biceps reflexes  Psychiatric:        Mood and Affect: Mood normal.      Assessment/Plan:   Encounter for general adult medical examination with abnormal findings Physical exam completed.  Encouraged continued healthy  diet.  Discussed getting back to exercise once we sort his neck out.  Discussed shingles vaccine and COVID-19 vaccine.  Advised that he is able to get the shingles vaccine currently and could get this here or at the pharmacy.  Discussed he could check with his insurance regarding cost.  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  Discussed that he cannot have any other vaccines within 14 days of a COVID-19 vaccine and thus we will defer the shingles vaccine at this time so that he is able to get the COVID-19 vaccine when it becomes available to him.  Lab work as outlined below.  Cervical radiculopathy Symptoms consistent with cervical radiculopathy.  Will obtain an x-ray today.  Trial of prednisone.  Discussed potential side effects of difficulty sleeping, increased appetite, and agitation related to the prednisone.  Return precautions given in AVS.   Orders Placed This Encounter  Procedures  . DG Cervical Spine Complete    Standing Status:   Future    Number of Occurrences:   1    Standing Expiration Date:   02/09/2021    Order Specific Question:   Reason for Exam (SYMPTOM  OR DIAGNOSIS REQUIRED)    Answer:   left neck pain with radiation down the posterior aspect of his left arm with associated numbness    Order Specific Question:   Preferred imaging location?    Answer:   [facility] Specific Question:   Radiology Contrast Protocol - do NOT remove file path    Answer:   [email or web address]  . Lipid panel  . PSA  . Comp Met (CMET)  . HgB A1c    Meds ordered this encounter  Medications  . predniSONE (DELTASONE) 20 MG tablet    Sig: Take 2 tablets (40 mg total) by mouth daily with breakfast.    Dispense:  10 tablet    Refill:  0    This visit occurred during the SARS-CoV-2 public health emergency.  Safety protocols were in place, including screening questions prior to the visit, additional usage of staff PPE, and extensive cleaning of exam room while observing appropriate contact time as indicated for disinfecting solutions.    [name], MD [place]

## 2019-12-14 LAB — COMPREHENSIVE METABOLIC PANEL
AG Ratio: 2 (calc) (ref 1.0–2.5)
ALT: 33 U/L (ref 9–46)
AST: 27 U/L (ref 10–35)
Albumin: 4.6 g/dL (ref 3.6–5.1)
Alkaline phosphatase (APISO): 75 U/L (ref 35–144)
BUN: 25 mg/dL (ref 7–25)
CO2: 28 mmol/L (ref 20–32)
Calcium: 9.7 mg/dL (ref 8.6–10.3)
Chloride: 103 mmol/L (ref 98–110)
Creat: 0.85 mg/dL (ref 0.70–1.33)
Globulin: 2.3 g/dL (calc) (ref 1.9–3.7)
Glucose, Bld: 84 mg/dL (ref 65–99)
Potassium: 4.6 mmol/L (ref 3.5–5.3)
Sodium: 139 mmol/L (ref 135–146)
Total Bilirubin: 0.5 mg/dL (ref 0.2–1.2)
Total Protein: 6.9 g/dL (ref 6.1–8.1)

## 2019-12-14 LAB — HEMOGLOBIN A1C
Hgb A1c MFr Bld: 5.2 % of total Hgb (ref <5.7)
Mean Plasma Glucose: 103 (calc)
eAG (mmol/L): 5.7 (calc)

## 2019-12-14 LAB — LIPID PANEL
Cholesterol: 245 mg/dL — ABNORMAL HIGH (ref <200)
HDL: 32 mg/dL — ABNORMAL LOW (ref >=40)
LDL Cholesterol (Calc): 177 mg/dL (calc) — ABNORMAL HIGH
Non-HDL Cholesterol (Calc): 213 mg/dL (calc) — ABNORMAL HIGH (ref <130)
Total CHOL/HDL Ratio: 7.7 (calc) — ABNORMAL HIGH (ref <5.0)
Triglycerides: 204 mg/dL — ABNORMAL HIGH (ref <150)

## 2019-12-14 LAB — PSA: PSA: 2.4 ng/mL (ref <=4.0)

## 2019-12-15 ENCOUNTER — Other Ambulatory Visit: Payer: Self-pay | Admitting: Family Medicine

## 2019-12-15 DIAGNOSIS — M542 Cervicalgia: Secondary | ICD-10-CM

## 2019-12-15 DIAGNOSIS — M5412 Radiculopathy, cervical region: Secondary | ICD-10-CM

## 2019-12-15 DIAGNOSIS — R937 Abnormal findings on diagnostic imaging of other parts of musculoskeletal system: Secondary | ICD-10-CM

## 2019-12-17 ENCOUNTER — Other Ambulatory Visit: Payer: Self-pay

## 2019-12-17 ENCOUNTER — Other Ambulatory Visit: Payer: Self-pay | Admitting: Family Medicine

## 2019-12-17 ENCOUNTER — Ambulatory Visit
Admission: RE | Admit: 2019-12-17 | Discharge: 2019-12-17 | Disposition: A | Discharge status: Home / Self Care | Payer: No Typology Code available for payment source | Source: Ambulatory Visit | Attending: Family Medicine | Admitting: Family Medicine

## 2019-12-17 DIAGNOSIS — M542 Cervicalgia: Secondary | ICD-10-CM | POA: Insufficient documentation

## 2019-12-17 DIAGNOSIS — M5412 Radiculopathy, cervical region: Secondary | ICD-10-CM

## 2019-12-17 DIAGNOSIS — R937 Abnormal findings on diagnostic imaging of other parts of musculoskeletal system: Secondary | ICD-10-CM

## 2019-12-17 IMAGING — CR DG ORBITS FOR FOREIGN BODY
2 series · 2 of 2 positions shown · non-contrast
Comparison: No prior.

CLINICAL DATA: Metal working/exposure; clearance prior to MRI

EXAM:
ORBITS FOR FOREIGN BODY - 2 VIEW

[orbits waters (1 of 2)]
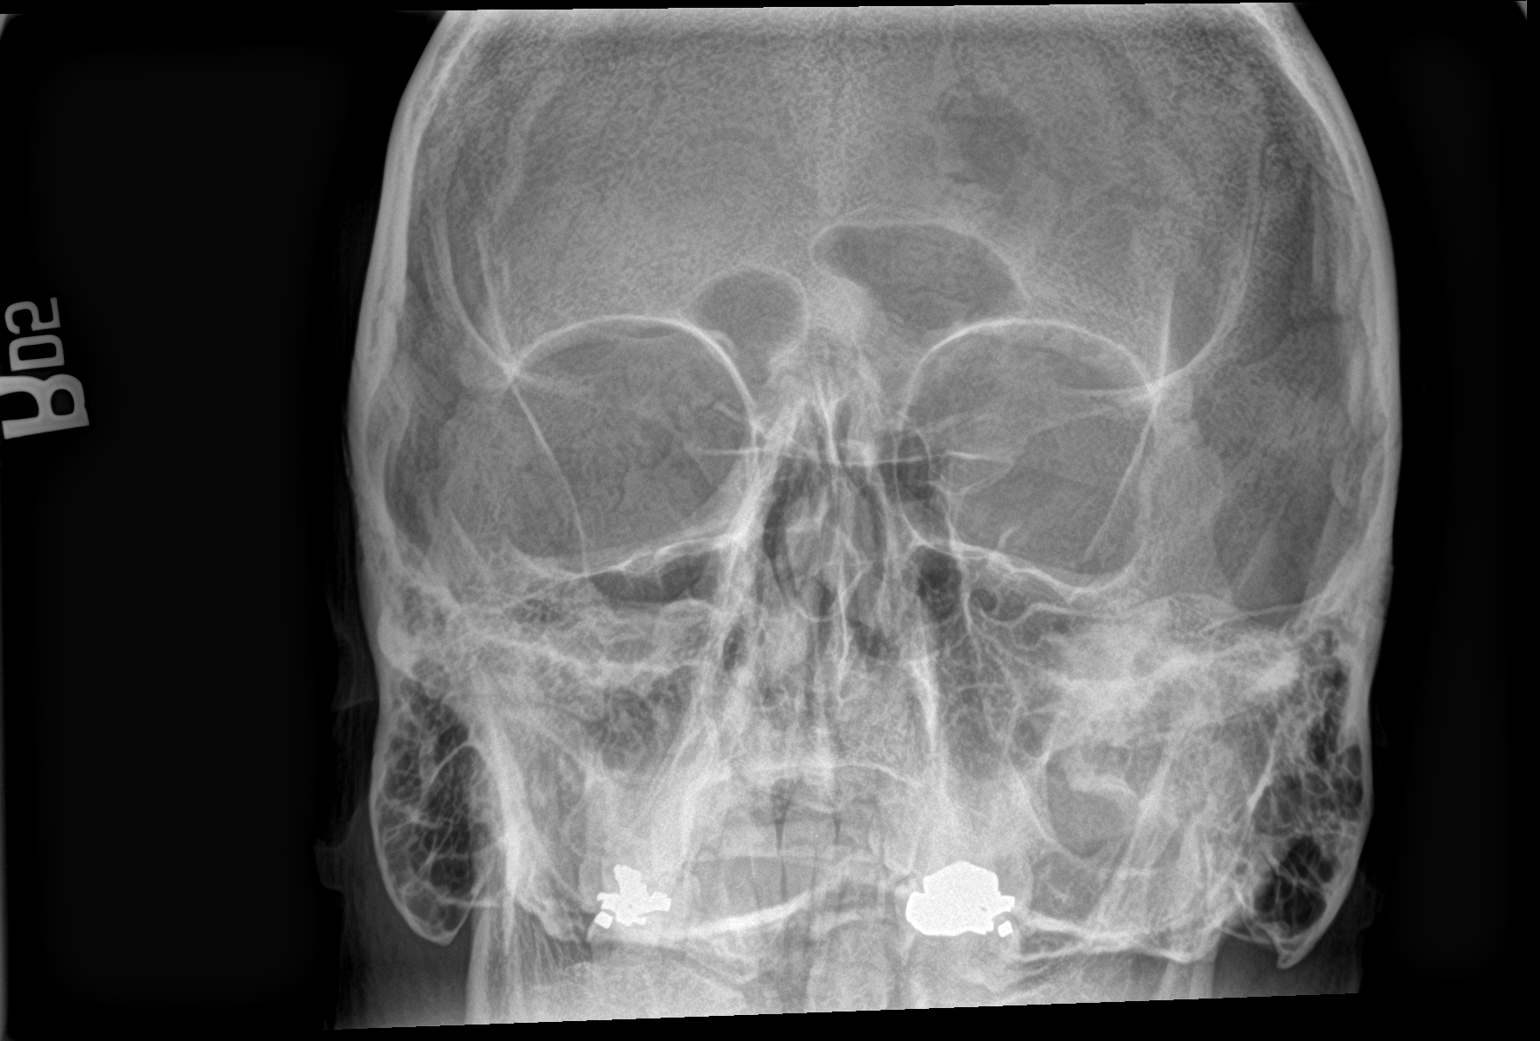

[orbits waters (2 of 2)]
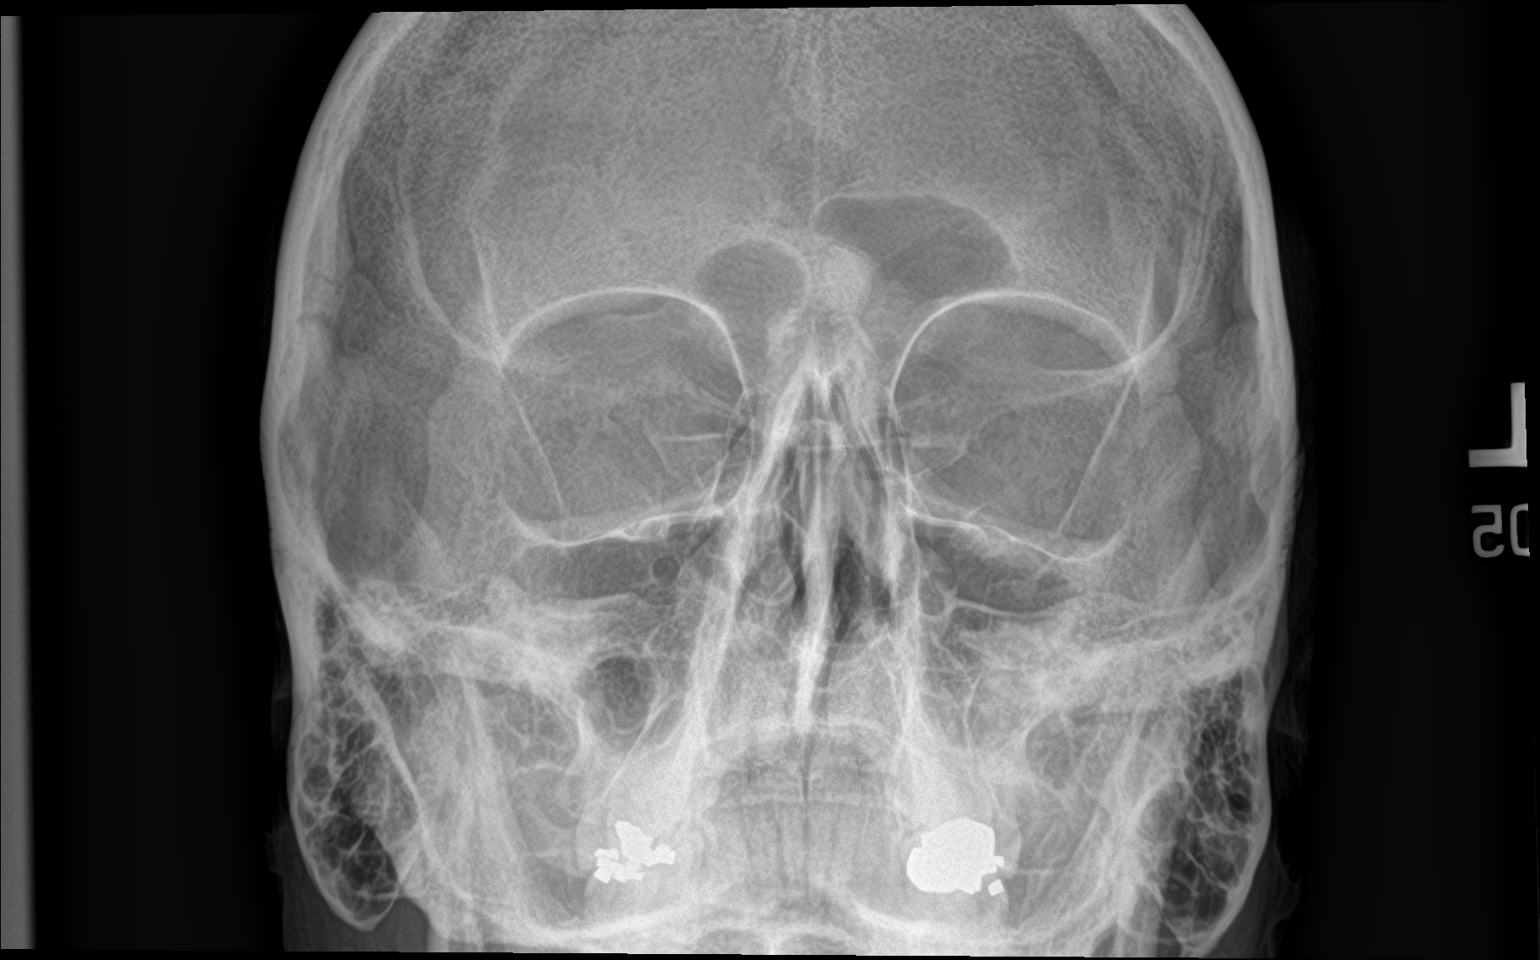

[2 of 2 positions shown; findings below may reference images not displayed]

FINDINGS: No metallic foreign bodies noted. No acute soft tissue or bony
abnormality. Paranasal sinuses and mastoids are clear.
IMPRESSION: No evidence of metallic foreign body within the orbits.

## 2019-12-17 IMAGING — MR MR CERVICAL SPINE W/O CM
5 series · 41 of 48 positions shown · non-contrast
Comparison: Cervical spine radiographs [DATE]

CLINICAL DATA: Neck pain with left arm pain

EXAM:
MRI CERVICAL SPINE WITHOUT CONTRAST
TECHNIQUE: Multiplanar, multisequence MR imaging of the cervical spine was
performed. No intravenous contrast was administered.

[Series 5: T2 · sagittal · 3.0mm · 0.62mm/px · 6 of 15 slices shown (1 of 2)]
[im 1/15]
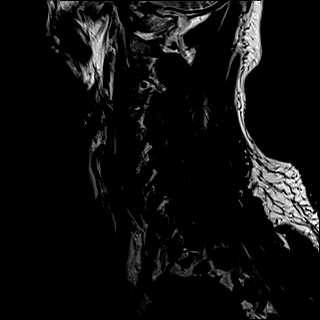
[im 3/15]
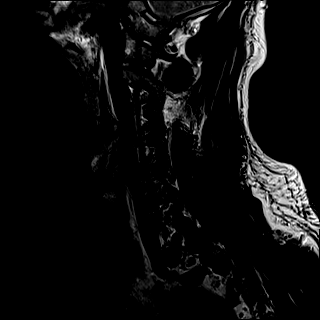
[im 6/15]
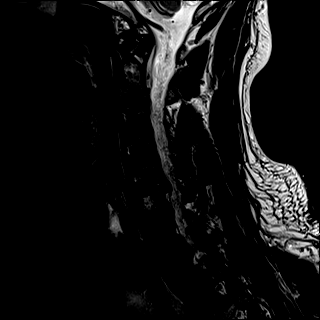
[im 9/15]
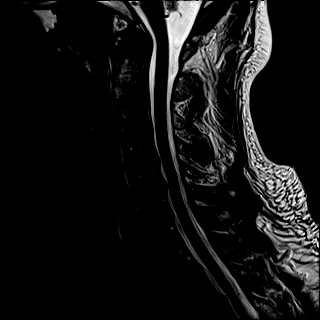
[im 12/15]
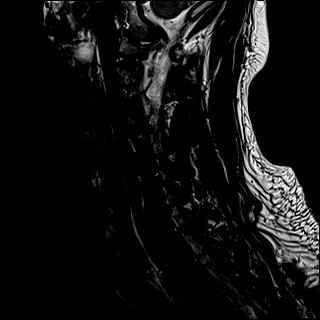
[im 15/15]
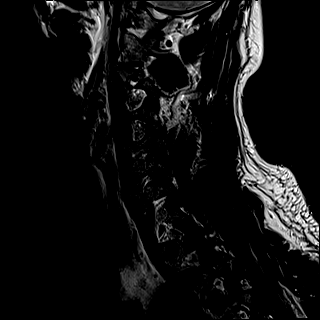

[Series 6: FLAIR · sagittal · 3.0mm · 0.78mm/px · 6 of 15 slices shown]
[im 1/15]
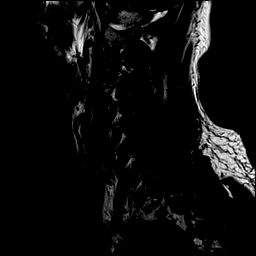
[im 3/15]
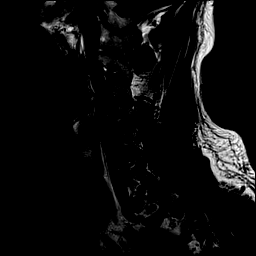
[im 6/15]
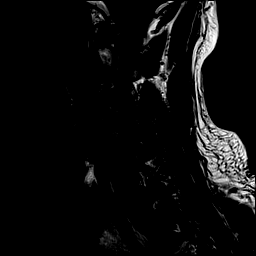
[im 9/15]
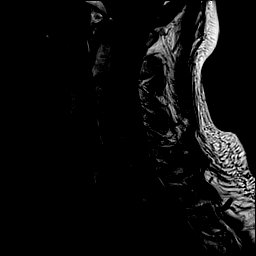
[im 12/15]
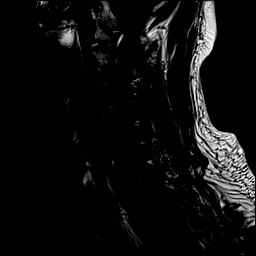
[im 15/15]
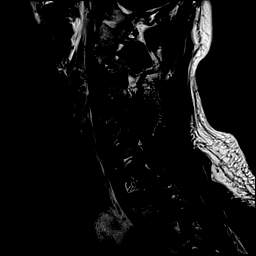

[Series 7: STIR · sagittal · 3.0mm · 0.62mm/px · 6 of 15 slices shown]
[im 1/15]
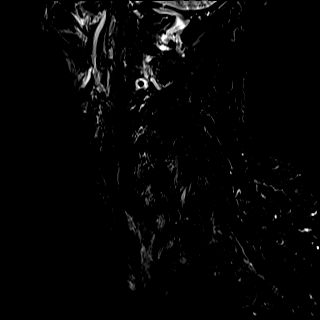
[im 3/15]
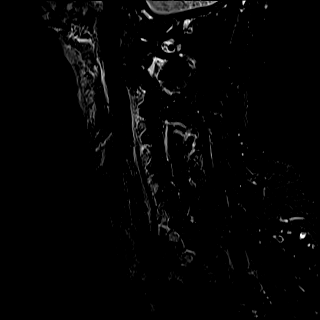
[im 6/15]
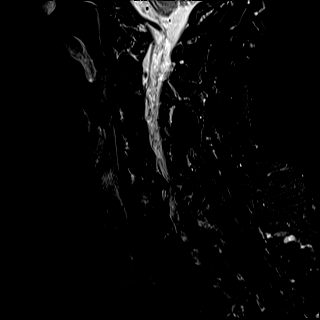
[im 9/15]
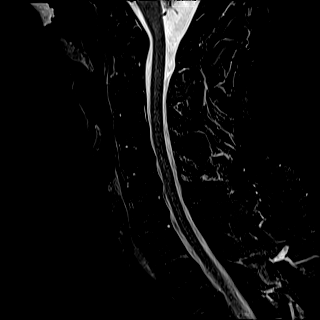
[im 12/15]
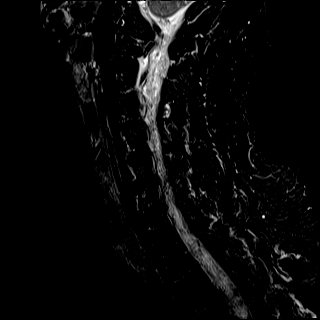
[im 15/15]
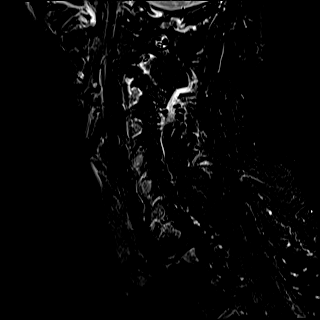

[Series 8: T2 · axial · 3.0mm · 0.70mm/px · z∈[-107,+9]mm · 15 of 35 slices shown (2 of 2)]
[im 1/35]
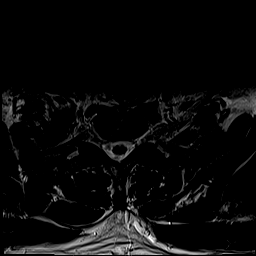
[im 3/35]
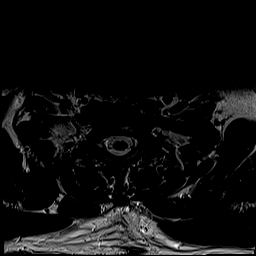
[im 5/35]
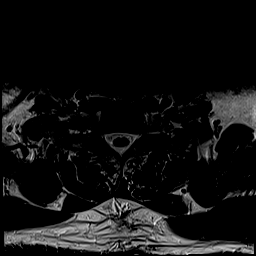
[im 8/35]
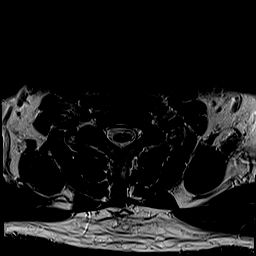
[im 10/35]
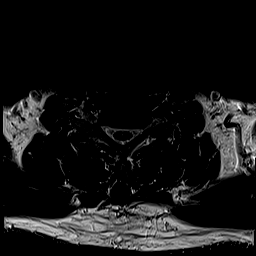
[im 13/35]
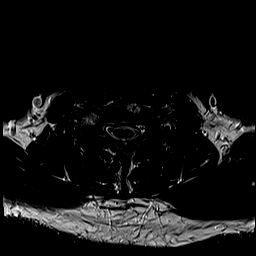
[im 15/35]
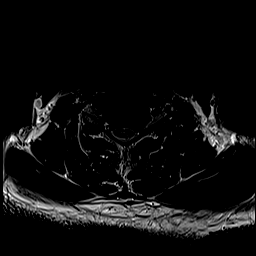
[im 18/35]
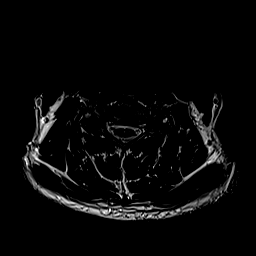
[im 20/35]
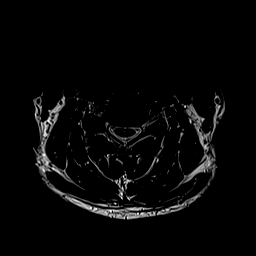
[im 22/35]
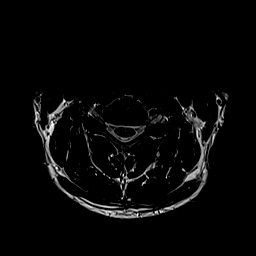
[im 25/35]
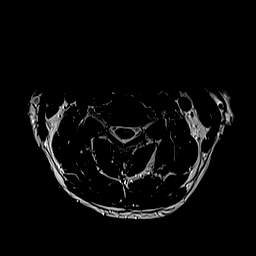
[im 27/35]
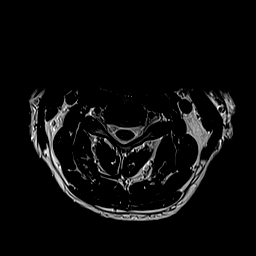
[im 30/35]
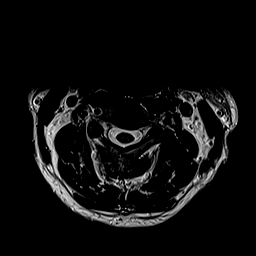
[im 32/35]
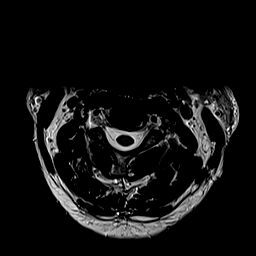
[im 35/35]
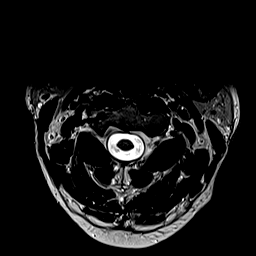

[Series 9: ax mpgr · axial · 3.0mm · 0.35mm/px · z∈[-107,+9]mm · 8 of 35 slices shown]
[im 1/35]
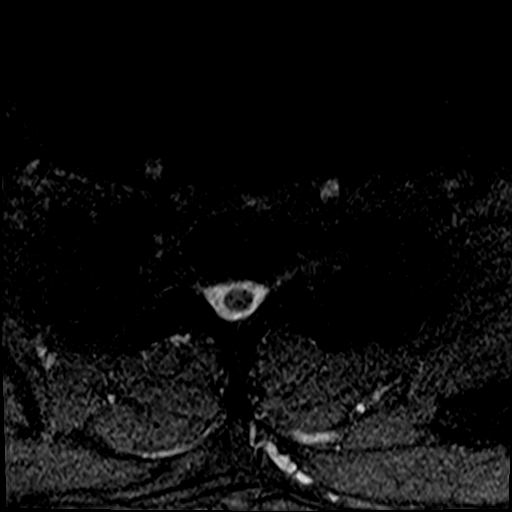
[im 5/35]
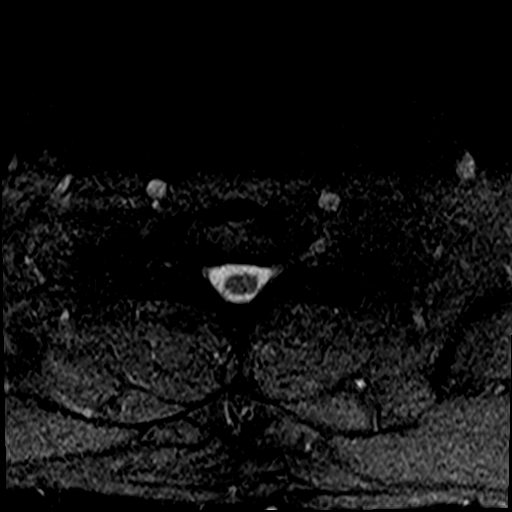
[im 10/35]
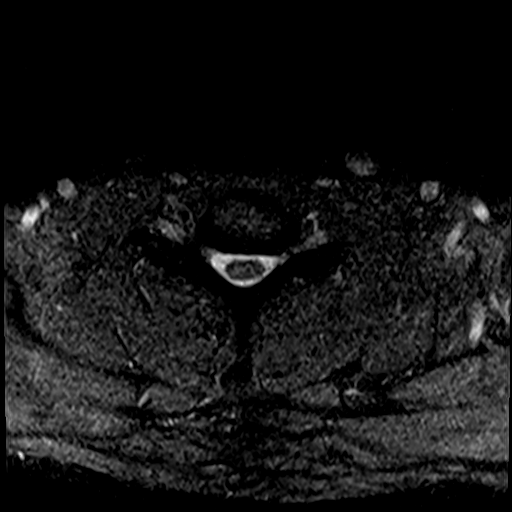
[im 15/35]
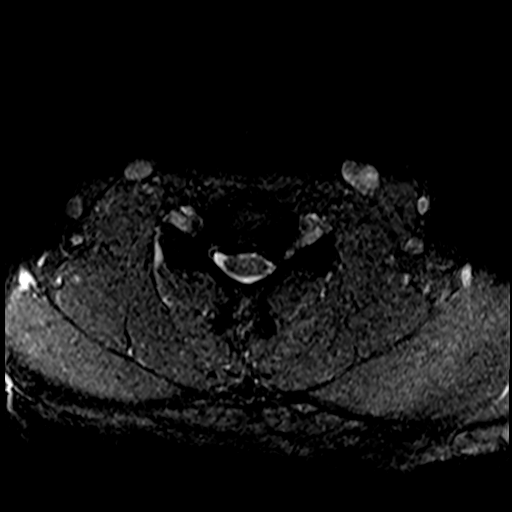
[im 20/35]
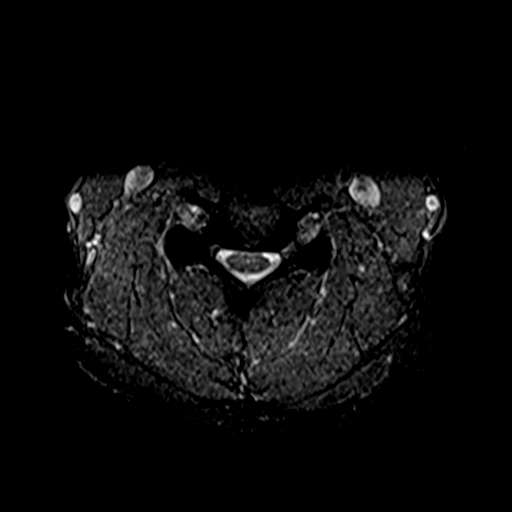
[im 25/35]
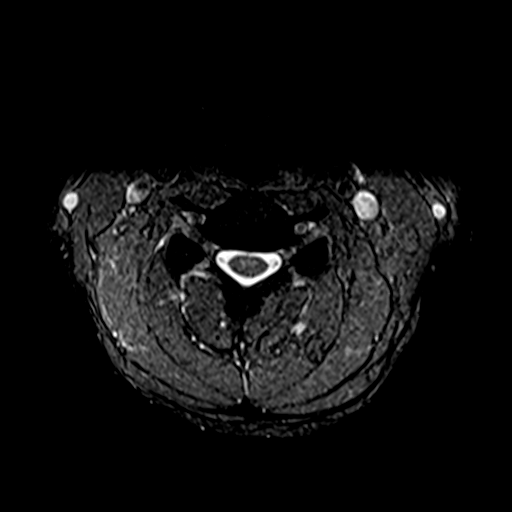
[im 30/35]
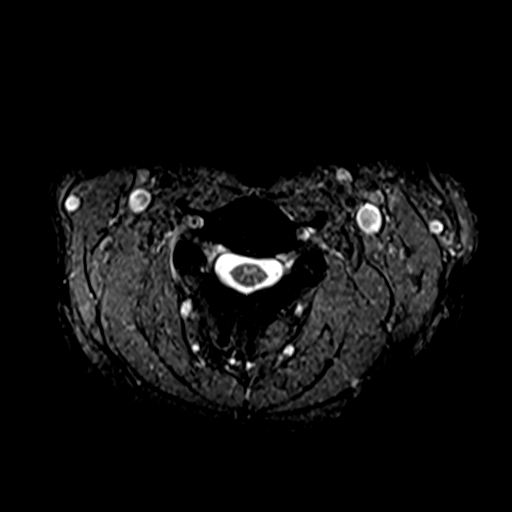
[im 35/35]
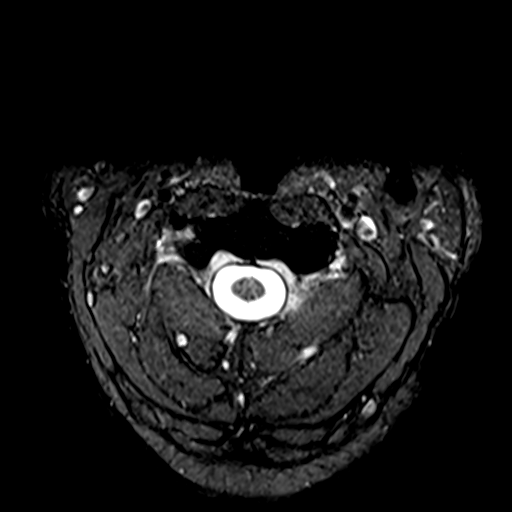

[41 of 48 positions shown; findings below may reference images not displayed]

FINDINGS: Alignment: Normal

Vertebrae: Normal bone marrow. Negative for fracture or mass.

Cord: Normal signal and morphology.

Posterior Fossa, vertebral arteries, paraspinal tissues: Negative

Disc levels:

C2-3: Negative

C3-4: Negative

C4-5: Mild disc bulging. Negative for stenosis

C5-6: Disc degeneration with disc space narrowing and diffuse
uncinate spurring bilaterally. Moderately large left-sided disc
protrusion with impingement of the left C6 nerve root. Moderate
foraminal encroachment bilaterally due to spurring. Cord flattening
anteriorly with mild to moderate spinal stenosis.

C6-7: Mild disc degeneration with disc bulging and mild spurring.
Mild foraminal narrowing bilaterally

C7-T1: Negative
IMPRESSION: 1. Moderately large left-sided disc protrusion C5-6 with impingement
of the left C6 nerve root. Mild to moderate spinal stenosis.
2. Mild foraminal narrowing bilaterally C6-7.

## 2019-12-19 ENCOUNTER — Telehealth: Payer: Self-pay

## 2019-12-19 ENCOUNTER — Other Ambulatory Visit: Payer: Self-pay | Admitting: Family Medicine

## 2019-12-19 DIAGNOSIS — E782 Mixed hyperlipidemia: Secondary | ICD-10-CM

## 2019-12-19 MED ORDER — ROSUVASTATIN CALCIUM 20 MG PO TABS: 20.0000 mg | ORAL_TABLET | Freq: Every day | ORAL | 3 refills | Status: DC

## 2019-12-19 NOTE — Telephone Encounter (Signed)
Tried to call patient to set up 6 week lab draw for CMP since starting cholesterol med. Was unable to leave message. Needs to scheduled.

## 2020-01-11 ENCOUNTER — Encounter: Payer: Self-pay | Admitting: Family Medicine

## 2020-02-07 ENCOUNTER — Encounter: Payer: Self-pay | Admitting: Family Medicine

## 2020-02-07 ENCOUNTER — Other Ambulatory Visit (INDEPENDENT_AMBULATORY_CARE_PROVIDER_SITE_OTHER): Payer: No Typology Code available for payment source

## 2020-02-07 ENCOUNTER — Other Ambulatory Visit: Payer: Self-pay

## 2020-02-07 DIAGNOSIS — E782 Mixed hyperlipidemia: Secondary | ICD-10-CM

## 2020-02-07 LAB — HEPATIC FUNCTION PANEL
ALT: 37 U/L (ref 0–53)
AST: 31 U/L (ref 0–37)
Albumin: 4.7 g/dL (ref 3.5–5.2)
Alkaline Phosphatase: 73 U/L (ref 39–117)
Bilirubin, Direct: 0.1 mg/dL (ref 0.0–0.3)
Total Bilirubin: 0.5 mg/dL (ref 0.2–1.2)
Total Protein: 7.3 g/dL (ref 6.0–8.3)

## 2020-02-07 LAB — LIPID PANEL
Cholesterol: 140 mg/dL (ref 0–200)
HDL: 36 mg/dL — ABNORMAL LOW (ref >39.00)
LDL Cholesterol: 87 mg/dL (ref 0–99)
NonHDL: 104.14
Total CHOL/HDL Ratio: 4
Triglycerides: 86 mg/dL (ref 0.0–149.0)
VLDL: 17.2 mg/dL (ref 0.0–40.0)

## 2020-04-08 ENCOUNTER — Other Ambulatory Visit: Payer: Self-pay | Admitting: Internal Medicine

## 2020-05-17 ENCOUNTER — Other Ambulatory Visit: Payer: Self-pay | Admitting: Neurology

## 2020-05-18 ENCOUNTER — Telehealth: Payer: Self-pay | Admitting: *Deleted

## 2020-05-18 NOTE — Telephone Encounter (Signed)
Received refill request for oxcarbazepine. He was last seen 04/10/20 with instructions to follow up in six months. I called and left him a detailed message (ok per DPR) to contact our office to schedule an appointment with MD or NP to continue refills. I also let him know he may contact his PCP to discuss, if he has no plans to follow up here. 90-day refill granted to pharmacy to avoid disruption to treatment.

## 2020-05-18 NOTE — Telephone Encounter (Signed)
Pt called to schedule appt. Pt request refill 90 day supply for Oxcarbazepine (TRILEPTAL) 300 MG tablet at [facility].

## 2020-07-12 ENCOUNTER — Other Ambulatory Visit: Payer: Self-pay | Admitting: Family Medicine

## 2020-07-21 ENCOUNTER — Encounter: Payer: Self-pay | Admitting: Family Medicine

## 2020-08-27 ENCOUNTER — Encounter: Payer: Self-pay | Admitting: Family Medicine

## 2020-09-30 NOTE — Progress Notes (Signed)
PATIENT: Benjamin Andrews DOB: Oct 26, 1965  REASON FOR VISIT: follow up HISTORY FROM: patient  HISTORY OF PRESENT ILLNESS: Today 10/01/20  HISTORY  Benjamin Andrews is a 54 year old male, seen in request by his primary care physician Dr. Tommi Andrews for evaluation of seizure  I have reviewed and summarized the referring note from the referring physician.  He had a history of anxiety, taking Buspar 10mg  bid, anxiety, also history of nocturnal seizure, taking Trileptal 300mg  bid.used to have sleep apnea, he lost 20 pounds recently, is no longer using CPAP machine  I was able to review his previous neurologist's note, Dr. Alfonso Andrews from [facility], most recent office note was on July 11, 2018, he has retired.  Patient reported a history of nocturnal seizure since 1998, there was a delayed diagnosis, he presented with right shoulder dislocation 3 times after woke up from overnight sleep, eventually had right shoulder surgery, shortly following his right shoulder surgery, he had a witnessed nocturnal generalized tonic-clonic seizure in 2000  Per patient, EEG, and MRI showed no significant abnormality, he was treated with a different antiepileptic medication, could not tolerated, then he was treated with Trileptal 150 mg 3 times a day, complains of daytime sleepiness, he has been on stable dose Trileptal 150mg  2 times a day for a long time, in 2015, in attempt to wean off Trileptal, he suffered another recurrent nocturnal seizure, woke up with whole-body muscle achy pain  He works as a Furniture conservator/restorer, does not want to have MRI of the brain, or EEG at this point  His maternal aunt, and maternal great grandmother suffered epilepsy,  Update October 01, 2020 SS: Here today for routine follow-up, continues to do well, last seizure was 3 years ago when he forgot to take his medication, has nocturnal seizures only. Knows had seizure because wakes up really sore.  Remains on Trileptal 300 mg  twice a day.  Tolerates well.  Previously had MRI of the brain and EEG with prior neurologist.  Continues to do well.  Works as a Furniture conservator/restorer.  REVIEW OF SYSTEMS: Out of a complete 14 system review of symptoms, the patient complains only of the following symptoms, and all other reviewed systems are negative.  N/A  ALLERGIES: No Known Allergies  HOME MEDICATIONS: Outpatient Medications Prior to Visit  Medication Sig Dispense Refill  . busPIRone (BUSPAR) 10 MG tablet Take 1 tablet by mouth twice daily 180 tablet 0  . Multiple Vitamin (MULTIVITAMIN) tablet Take 1 tablet by mouth daily.    . Omega-3 Fatty Acids (FISH OIL CONCENTRATE PO) Take by mouth.    . rosuvastatin (CRESTOR) 20 MG tablet Take 1 tablet (20 mg total) by mouth daily. 90 tablet 3  . Oxcarbazepine (TRILEPTAL) 300 MG tablet Take 1 tablet (300 mg total) by mouth 2 (two) times daily. Please call [phone] to schedule appt or may also discuss refills with PCP. 180 tablet 0  . predniSONE (DELTASONE) 20 MG tablet Take 2 tablets (40 mg total) by mouth daily with breakfast. 10 tablet 0   No facility-administered medications prior to visit.    PAST MEDICAL HISTORY: Past Medical History:  Diagnosis Date  . Allergic rhinitis   . Blood in stool   . Chickenpox   . Headache   . Hemorrhoids   . High blood pressure   . Seizures ([place])   . Sleep apnea   . UTI (lower urinary tract infection)     PAST SURGICAL HISTORY: Past Surgical History:  Procedure Laterality  Date  . broken nose    . COLONOSCOPY WITH PROPOFOL N/A 12/04/2015   Procedure: COLONOSCOPY WITH PROPOFOL;  Surgeon: Manya Silvas, MD;  Location: [facility] ENDOSCOPY;  Service: Endoscopy;  Laterality: N/A;  . SHOULDER SURGERY      FAMILY HISTORY: Family History  Problem Relation Age of Onset  . Alcoholism Other        Grandparent  . Arthritis Other        Grandparent  . Stroke Other        Parent  . Diabetes Other        Grandparent  . Rectal cancer Maternal Uncle    . Cancer Mother        unsure - started in back  . Healthy Father     SOCIAL HISTORY: Social History   Socioeconomic History  . Marital status: Significant Other    Spouse name: Not on file  . Number of children: 1  . Years of education: some college  . Highest education level: Not on file  Occupational History  . Occupation: Furniture conservator/restorer  Tobacco Use  . Smoking status: Never Smoker  . Smokeless tobacco: Former Network engineer and Sexual Activity  . Alcohol use: Yes    Alcohol/week: 0.0 standard drinks    Comment: Infrequently  . Drug use: No  . Sexual activity: Not on file  Other Topics Concern  . Not on file  Social History Narrative   Lives at home with his significant other.   Right-handed.   Two cups caffeine per day.   Social Determinants of Health   Financial Resource Strain: Not on file  Food Insecurity: Not on file  Transportation Needs: Not on file  Physical Activity: Not on file  Stress: Not on file  Social Connections: Not on file  Intimate Partner Violence: Not on file    PHYSICAL EXAM  Vitals:   10/01/20 0729  BP: 124/72  Pulse: 72  Weight: 241 lb (109.3 kg)  Height: 6\' 3"  (1.905 m)   Body mass index is 30.12 kg/m.  Generalized: Well developed, in no acute distress   Neurological examination  Mentation: Alert oriented to time, place, history taking. Follows all commands speech and language fluent Cranial nerve II-XII: Pupils were equal round reactive to light. Extraocular movements were full, visual field were full on confrontational test. Facial sensation and strength were normal.  Head turning and shoulder shrug  were normal and symmetric. Motor: The motor testing reveals 5 over 5 strength of all 4 extremities. Good symmetric motor tone is noted throughout.  Sensory: Sensory testing is intact to soft touch on all 4 extremities. No evidence of extinction is noted.  Coordination: Cerebellar testing reveals good finger-nose-finger and  heel-to-shin bilaterally.  Gait and station: Gait is normal. Tandem gait is normal. Romberg is negative. No drift is seen.  Reflexes: Deep tendon reflexes are symmetric and normal bilaterally.   DIAGNOSTIC DATA (LABS, IMAGING, TESTING) - I reviewed patient records, labs, notes, testing and imaging myself where available.  Lab Results  Component Value Date   WBC 7.7 11/09/2018   HGB 15.8 11/09/2018   HCT 46.6 11/09/2018   MCV 89.8 11/09/2018   PLT 156 11/09/2018      Component Value Date/Time   NA 139 12/13/2019 1637   K 4.6 12/13/2019 1637   CL 103 12/13/2019 1637   CO2 28 12/13/2019 1637   GLUCOSE 84 12/13/2019 1637   BUN 25 12/13/2019 1637   CREATININE 0.85 12/13/2019 1637  CALCIUM 9.7 12/13/2019 1637   PROT 7.3 02/07/2020 0802   ALBUMIN 4.7 02/07/2020 0802   AST 31 02/07/2020 0802   ALT 37 02/07/2020 0802   ALKPHOS 73 02/07/2020 0802   BILITOT 0.5 02/07/2020 0802   GFRNONAA >60 08/29/2016 0052   GFRAA >60 08/29/2016 0052   Lab Results  Component Value Date   CHOL 140 02/07/2020   HDL 36.00 (L) 02/07/2020   LDLCALC 87 02/07/2020   TRIG 86.0 02/07/2020   CHOLHDL 4 02/07/2020   Lab Results  Component Value Date   HGBA1C 5.2 12/13/2019   No results found for: VITAMINB12 Lab Results  Component Value Date   TSH 1.34 10/07/2015      ASSESSMENT AND PLAN 54 y.o. year old male  has a past medical history of Allergic rhinitis, Blood in stool, Chickenpox, Headache, Hemorrhoids, High blood pressure, Seizures ([place]), Sleep apnea, and UTI (lower urinary tract infection). here with:  1.  Epilepsy -Last seizure about 3 years ago, when he forgot to take his medication -Continue Trileptal 300 mg twice daily -Has not desired MRI of the brain, or EEG at this point -Check routine labs today -Call for seizure activity, otherwise follow-up 1 year or sooner if needed (since stable, if PCP is willing to assume medication, can follow-up here as needed)  I spent 20 minutes of  face-to-face and non-face-to-face time with patient.  This included previsit chart review, lab review, study review, order entry, electronic health record documentation, patient education.  Butler Denmark, AGNP-C, DNP 10/01/2020, 7:56 AM [facility] [address] [place], [place] [address] [phone]

## 2020-10-01 ENCOUNTER — Other Ambulatory Visit: Payer: Self-pay

## 2020-10-01 ENCOUNTER — Ambulatory Visit (INDEPENDENT_AMBULATORY_CARE_PROVIDER_SITE_OTHER): Payer: No Typology Code available for payment source | Admitting: Neurology

## 2020-10-01 ENCOUNTER — Encounter: Payer: Self-pay | Admitting: Neurology

## 2020-10-01 VITALS — BP 124/72 | HR 72 | Ht 75.0 in | Wt 241.0 lb

## 2020-10-01 DIAGNOSIS — R569 Unspecified convulsions: Secondary | ICD-10-CM | POA: Diagnosis not present

## 2020-10-01 MED ORDER — OXCARBAZEPINE 300 MG PO TABS: 300.0000 mg | ORAL_TABLET | Freq: Two times a day (BID) | ORAL | 4 refills | Status: DC

## 2020-10-01 NOTE — Patient Instructions (Signed)
Continue current medications Check labs today  Call for seizures See you back in 1 year  

## 2020-10-05 ENCOUNTER — Ambulatory Visit: Payer: No Typology Code available for payment source | Admitting: Neurology

## 2020-10-06 ENCOUNTER — Other Ambulatory Visit: Payer: Self-pay | Admitting: Family Medicine

## 2020-10-06 LAB — COMPREHENSIVE METABOLIC PANEL
ALT: 56 IU/L — ABNORMAL HIGH (ref 0–44)
AST: 37 IU/L (ref 0–40)
Albumin/Globulin Ratio: 2.1 (ref 1.2–2.2)
Albumin: 4.7 g/dL (ref 3.8–4.9)
Alkaline Phosphatase: 76 IU/L (ref 44–121)
BUN/Creatinine Ratio: 20 (ref 9–20)
BUN: 18 mg/dL (ref 6–24)
Bilirubin Total: 0.4 mg/dL (ref 0.0–1.2)
CO2: 25 mmol/L (ref 20–29)
Calcium: 9.6 mg/dL (ref 8.7–10.2)
Chloride: 101 mmol/L (ref 96–106)
Creatinine, Ser: 0.92 mg/dL (ref 0.76–1.27)
GFR calc Af Amer: 108 mL/min/{1.73_m2} (ref >59)
GFR calc non Af Amer: 93 mL/min/{1.73_m2} (ref >59)
Globulin, Total: 2.2 g/dL (ref 1.5–4.5)
Glucose: 93 mg/dL (ref 65–99)
Potassium: 5 mmol/L (ref 3.5–5.2)
Sodium: 142 mmol/L (ref 134–144)
Total Protein: 6.9 g/dL (ref 6.0–8.5)

## 2020-10-06 LAB — CBC WITH DIFFERENTIAL/PLATELET
Basophils Absolute: 0.1 10*3/uL (ref 0.0–0.2)
Basos: 1 %
EOS (ABSOLUTE): 0.6 10*3/uL — ABNORMAL HIGH (ref 0.0–0.4)
Eos: 7 %
Hematocrit: 51.1 % — ABNORMAL HIGH (ref 37.5–51.0)
Hemoglobin: 16.7 g/dL (ref 13.0–17.7)
Immature Grans (Abs): 0 10*3/uL (ref 0.0–0.1)
Immature Granulocytes: 1 %
Lymphocytes Absolute: 2.4 10*3/uL (ref 0.7–3.1)
Lymphs: 31 %
MCH: 30 pg (ref 26.6–33.0)
MCHC: 32.7 g/dL (ref 31.5–35.7)
MCV: 92 fL (ref 79–97)
Monocytes Absolute: 0.6 10*3/uL (ref 0.1–0.9)
Monocytes: 8 %
Neutrophils Absolute: 4 10*3/uL (ref 1.4–7.0)
Neutrophils: 52 %
Platelets: 147 10*3/uL — ABNORMAL LOW (ref 150–450)
RBC: 5.56 x10E6/uL (ref 4.14–5.80)
RDW: 12.8 % (ref 11.6–15.4)
WBC: 7.7 10*3/uL (ref 3.4–10.8)

## 2020-10-06 LAB — 10-HYDROXYCARBAZEPINE: Oxcarbazepine SerPl-Mcnc: 3 ug/mL — ABNORMAL LOW (ref 10–35)

## 2020-11-19 NOTE — Progress Notes (Signed)
I have reviewed and agreed above plan. 

## 2020-11-29 ENCOUNTER — Other Ambulatory Visit: Payer: Self-pay | Admitting: Family Medicine

## 2020-11-29 DIAGNOSIS — E782 Mixed hyperlipidemia: Secondary | ICD-10-CM

## 2020-12-18 ENCOUNTER — Encounter: Payer: No Typology Code available for payment source | Admitting: Family Medicine

## 2020-12-25 ENCOUNTER — Encounter: Payer: No Typology Code available for payment source | Admitting: Family Medicine

## 2020-12-31 ENCOUNTER — Other Ambulatory Visit: Payer: Self-pay | Admitting: Family Medicine

## 2021-01-05 ENCOUNTER — Other Ambulatory Visit: Payer: Self-pay

## 2021-01-08 ENCOUNTER — Encounter: Payer: Self-pay | Admitting: Family Medicine

## 2021-01-08 ENCOUNTER — Ambulatory Visit (INDEPENDENT_AMBULATORY_CARE_PROVIDER_SITE_OTHER): Payer: No Typology Code available for payment source | Admitting: Family Medicine

## 2021-01-08 ENCOUNTER — Other Ambulatory Visit: Payer: Self-pay

## 2021-01-08 VITALS — BP 120/80 | HR 68 | Temp 97.5°F | Ht 75.0 in | Wt 232.4 lb

## 2021-01-08 DIAGNOSIS — Z125 Encounter for screening for malignant neoplasm of prostate: Secondary | ICD-10-CM

## 2021-01-08 DIAGNOSIS — E7800 Pure hypercholesterolemia, unspecified: Secondary | ICD-10-CM

## 2021-01-08 DIAGNOSIS — Z1159 Encounter for screening for other viral diseases: Secondary | ICD-10-CM

## 2021-01-08 DIAGNOSIS — Z0000 Encounter for general adult medical examination without abnormal findings: Secondary | ICD-10-CM | POA: Diagnosis not present

## 2021-01-08 DIAGNOSIS — R569 Unspecified convulsions: Secondary | ICD-10-CM

## 2021-01-08 DIAGNOSIS — Z1211 Encounter for screening for malignant neoplasm of colon: Secondary | ICD-10-CM

## 2021-01-08 DIAGNOSIS — E663 Overweight: Secondary | ICD-10-CM

## 2021-01-08 DIAGNOSIS — Z1283 Encounter for screening for malignant neoplasm of skin: Secondary | ICD-10-CM

## 2021-01-08 NOTE — Progress Notes (Signed)
Benjamin Rumps, MD Phone: [phone]  Benjamin Andrews is a 55 y.o. male who presents today for CPE.  Diet: Generally healthy.  Tries to avoid sweets.  No soda or sweet tea. Exercise: He does some body weight exercises 3 days a week in the morning and then does cycling 2 days a week in the evening. Colonoscopy: Due Prostate cancer screening: Due Family history-  Prostate cancer: No  Colon cancer: Rectal cancer in his maternal uncle Vaccines-   Flu: Up-to-date  Tetanus: Up-to-date  Shingles: Due  COVID19: Up-to-date HIV screening: Up-to-date Hep C Screening: Due Tobacco use: No Alcohol use: Rare Illicit Drug use: No Dentist: Yes Ophthalmology: No, reports he saw them about 4 years ago and they advised he had no issues other than needing reading glasses   Active Ambulatory Problems    Diagnosis Date Noted  . Generalized anxiety disorder 09/30/2015  . Routine general medical examination at a health care facility 09/30/2015  . Elevated LDL cholesterol level 12/30/2015  . Erectile dysfunction 03/16/2016  . Skin lesion of face 06/16/2016  . Overweight 06/16/2016  . Seizures ([place]) 10/14/2016  . Strain of calf muscle 05/25/2017  . Cervical radiculopathy 12/13/2019   Resolved Ambulatory Problems    Diagnosis Date Noted  . No Resolved Ambulatory Problems   Past Medical History:  Diagnosis Date  . Allergic rhinitis   . Blood in stool   . Chickenpox   . Headache   . Hemorrhoids   . High blood pressure   . Sleep apnea   . UTI (lower urinary tract infection)     Family History  Problem Relation Age of Onset  . Alcoholism Other        Grandparent  . Arthritis Other        Grandparent  . Stroke Other        Parent  . Diabetes Other        Grandparent  . Rectal cancer Maternal Uncle   . Cancer Mother        unsure - started in back  . Healthy Father     Social History   Socioeconomic History  . Marital status: Significant Other    Spouse name: Not on file  .  Number of children: 1  . Years of education: some college  . Highest education level: Not on file  Occupational History  . Occupation: Furniture conservator/restorer  Tobacco Use  . Smoking status: Never Smoker  . Smokeless tobacco: Former Network engineer and Sexual Activity  . Alcohol use: Yes    Alcohol/week: 0.0 standard drinks    Comment: Infrequently  . Drug use: No  . Sexual activity: Not on file  Other Topics Concern  . Not on file  Social History Narrative   Lives at home with his significant other.   Right-handed.   Two cups caffeine per day.   Social Determinants of Health   Financial Resource Strain: Not on file  Food Insecurity: Not on file  Transportation Needs: Not on file  Physical Activity: Not on file  Stress: Not on file  Social Connections: Not on file  Intimate Partner Violence: Not on file    ROS  General:  Negative for nexplained weight loss, fever Skin: Negative for new or changing mole, sore that won't heal HEENT: Negative for trouble hearing, trouble seeing, ringing in ears, mouth sores, hoarseness, change in voice, dysphagia. CV:  Negative for chest pain, dyspnea, edema, palpitations Resp: Negative for cough, dyspnea, hemoptysis GI: Negative for nausea,  vomiting, diarrhea, constipation, abdominal pain, melena, hematochezia. GU: Negative for dysuria, incontinence, urinary hesitance, hematuria, vaginal or penile discharge, polyuria, sexual difficulty, lumps in testicle or breasts MSK: Negative for muscle cramps or aches, joint pain or swelling Neuro: Negative for headaches, weakness, numbness, dizziness, passing out/fainting Psych: Negative for depression, anxiety, memory problems  Objective  Physical Exam Vitals:   01/08/21 1407  BP: 120/80  Pulse: 68  Temp: (!) 97.5 F (36.4 C)  SpO2: 97%    BP Readings from Last 3 Encounters:  01/08/21 120/80  10/01/20 124/72  12/13/19 130/80   Wt Readings from Last 3 Encounters:  01/08/21 232 lb 6.4 oz (105.4 kg)   10/01/20 241 lb (109.3 kg)  12/13/19 230 lb 6.4 oz (104.5 kg)    Physical Exam Constitutional:      General: He is not in acute distress.    Appearance: He is not diaphoretic.  Eyes:     Conjunctiva/sclera: Conjunctivae normal.     Pupils: Pupils are equal, round, and reactive to light.  Cardiovascular:     Rate and Rhythm: Normal rate and regular rhythm.     Heart sounds: Normal heart sounds.  Pulmonary:     Effort: Pulmonary effort is normal.     Breath sounds: Normal breath sounds.  Abdominal:     General: Bowel sounds are normal. There is no distension.     Palpations: Abdomen is soft.     Tenderness: There is no abdominal tenderness. There is no guarding or rebound.  Musculoskeletal:     Right lower leg: No edema.     Left lower leg: No edema.  Lymphadenopathy:     Cervical: No cervical adenopathy.  Skin:    General: Skin is warm and dry.          Comments: Skin tag versus nevus noted in right axilla  Neurological:     Mental Status: He is alert.  Psychiatric:        Mood and Affect: Mood normal.      Assessment/Plan:   Problem List Items Addressed This Visit    Elevated LDL cholesterol level   Relevant Orders   Comp Met (CMET)   Lipid panel   Overweight   Relevant Orders   HgB A1c   Routine general medical examination at a health care facility - Primary    Physical exam completed.  Encouraged continued healthy diet and exercise.  Will refer to GI for colon cancer screening.  PSA for prostate cancer screening.  I encouraged him to get the Shingrix vaccine though he defers to next year when he will have different insurance.  Hepatitis C for hepatitis C screening.  Discussed periodically seeing an eye doctor would not be a bad idea at his age.  Lab work as outlined.  The patient also request dermatology referral for full body skin exam.      Seizures ([place])    The patient has a history of seizures though none in the last 3 years.  He has seen neurology  recently and they noted that he could see them once a year or if I would be willing to take over the prescription he could follow-up with them as needed.  Discussed that I do not manage seizure medication and I would recommend that he continue to see neurology once a year.       Other Visit Diagnoses    Prostate cancer screening       Relevant Orders   PSA  Need for hepatitis C screening test       Relevant Orders   Hepatitis C Antibody   Skin exam, screening for cancer       Relevant Orders   Ambulatory referral to Dermatology   Colon cancer screening       Relevant Orders   Ambulatory referral to Gastroenterology      This visit occurred during the SARS-CoV-2 public health emergency.  Safety protocols were in place, including screening questions prior to the visit, additional usage of staff PPE, and extensive cleaning of exam room while observing appropriate contact time as indicated for disinfecting solutions.    Benjamin Rumps, MD [place]

## 2021-01-08 NOTE — Assessment & Plan Note (Addendum)
Physical exam completed.  Encouraged continued healthy diet and exercise.  Will refer to GI for colon cancer screening.  PSA for prostate cancer screening.  I encouraged him to get the Shingrix vaccine though he defers to next year when he will have different insurance.  Hepatitis C for hepatitis C screening.  Discussed periodically seeing an eye doctor would not be a bad idea at his age.  Lab work as outlined.  The patient also request dermatology referral for full body skin exam.

## 2021-01-08 NOTE — Assessment & Plan Note (Signed)
The patient has a history of seizures though none in the last 3 years.  He has seen neurology recently and they noted that he could see them once a year or if I would be willing to take over the prescription he could follow-up with them as needed.  Discussed that I do not manage seizure medication and I would recommend that he continue to see neurology once a year.

## 2021-01-08 NOTE — Patient Instructions (Signed)
Nice to see you. Please continue healthy diet and exercise. We will get labs today and contact you with the results. If you do not hear from GI or dermatology within the next 2 weeks please let us know.

## 2021-01-11 LAB — COMPREHENSIVE METABOLIC PANEL WITH GFR
AG Ratio: 1.9 (calc) (ref 1.0–2.5)
ALT: 29 U/L (ref 9–46)
AST: 29 U/L (ref 10–35)
Albumin: 4.4 g/dL (ref 3.6–5.1)
Alkaline phosphatase (APISO): 71 U/L (ref 35–144)
BUN: 23 mg/dL (ref 7–25)
CO2: 24 mmol/L (ref 20–32)
Calcium: 9.4 mg/dL (ref 8.6–10.3)
Chloride: 102 mmol/L (ref 98–110)
Creat: 1.05 mg/dL (ref 0.70–1.33)
Globulin: 2.3 g/dL (ref 1.9–3.7)
Glucose, Bld: 78 mg/dL (ref 65–99)
Potassium: 4.7 mmol/L (ref 3.5–5.3)
Sodium: 138 mmol/L (ref 135–146)
Total Bilirubin: 0.4 mg/dL (ref 0.2–1.2)
Total Protein: 6.7 g/dL (ref 6.1–8.1)

## 2021-01-11 LAB — HEMOGLOBIN A1C
Hgb A1c MFr Bld: 5.3 % of total Hgb (ref <5.7)
Mean Plasma Glucose: 105 mg/dL
eAG (mmol/L): 5.8 mmol/L

## 2021-01-11 LAB — HEPATITIS C ANTIBODY
Hepatitis C Ab: NONREACTIVE
SIGNAL TO CUT-OFF: 0.02 (ref <1.00)

## 2021-01-11 LAB — LIPID PANEL
Cholesterol: 139 mg/dL (ref <200)
HDL: 36 mg/dL — ABNORMAL LOW (ref >=40)
LDL Cholesterol (Calc): 83 mg/dL (calc)
Non-HDL Cholesterol (Calc): 103 mg/dL (calc) (ref <130)
Total CHOL/HDL Ratio: 3.9 (calc) (ref <5.0)
Triglycerides: 103 mg/dL (ref <150)

## 2021-01-11 LAB — PSA: PSA: 2.35 ng/mL (ref <=4.0)

## 2021-03-07 ENCOUNTER — Other Ambulatory Visit: Payer: Self-pay | Admitting: Family Medicine

## 2021-03-07 DIAGNOSIS — E782 Mixed hyperlipidemia: Secondary | ICD-10-CM

## 2021-04-13 ENCOUNTER — Other Ambulatory Visit: Payer: Self-pay | Admitting: Family Medicine

## 2021-04-13 ENCOUNTER — Other Ambulatory Visit: Payer: Self-pay

## 2021-06-01 ENCOUNTER — Other Ambulatory Visit: Payer: Self-pay | Admitting: Family Medicine

## 2021-06-01 DIAGNOSIS — E782 Mixed hyperlipidemia: Secondary | ICD-10-CM

## 2021-06-18 ENCOUNTER — Telehealth: Payer: Self-pay | Admitting: Family Medicine

## 2021-06-18 LAB — HM COLONOSCOPY

## 2021-06-18 NOTE — Telephone Encounter (Signed)
Patient had his colonoscopy today. The doctor told him he would needs another next year. Patient is requesting a referral for next years colonoscopy.

## 2021-06-18 NOTE — Telephone Encounter (Signed)
We can complete the referral closer to the time that it is needed.  You will be due for a physical in March 2023.  We can place the order then.

## 2021-07-07 ENCOUNTER — Other Ambulatory Visit: Payer: Self-pay | Admitting: Family Medicine

## 2021-07-09 ENCOUNTER — Other Ambulatory Visit: Payer: Self-pay | Admitting: Family Medicine

## 2021-07-16 ENCOUNTER — Encounter: Payer: Self-pay | Admitting: Family Medicine

## 2021-07-21 ENCOUNTER — Ambulatory Visit: Payer: No Typology Code available for payment source | Admitting: Dermatology

## 2021-07-21 ENCOUNTER — Ambulatory Visit (INDEPENDENT_AMBULATORY_CARE_PROVIDER_SITE_OTHER): Payer: No Typology Code available for payment source | Admitting: Dermatology

## 2021-07-21 ENCOUNTER — Other Ambulatory Visit: Payer: Self-pay

## 2021-07-21 DIAGNOSIS — L720 Epidermal cyst: Secondary | ICD-10-CM

## 2021-07-21 DIAGNOSIS — D3617 Benign neoplasm of peripheral nerves and autonomic nervous system of trunk, unspecified: Secondary | ICD-10-CM

## 2021-07-21 DIAGNOSIS — D367 Benign neoplasm of other specified sites: Secondary | ICD-10-CM

## 2021-07-21 DIAGNOSIS — L821 Other seborrheic keratosis: Secondary | ICD-10-CM

## 2021-07-21 DIAGNOSIS — D369 Benign neoplasm, unspecified site: Secondary | ICD-10-CM

## 2021-07-21 DIAGNOSIS — Z1283 Encounter for screening for malignant neoplasm of skin: Secondary | ICD-10-CM | POA: Diagnosis not present

## 2021-07-21 DIAGNOSIS — I8393 Asymptomatic varicose veins of bilateral lower extremities: Secondary | ICD-10-CM

## 2021-07-21 DIAGNOSIS — L578 Other skin changes due to chronic exposure to nonionizing radiation: Secondary | ICD-10-CM

## 2021-07-21 DIAGNOSIS — L820 Inflamed seborrheic keratosis: Secondary | ICD-10-CM

## 2021-07-21 DIAGNOSIS — B078 Other viral warts: Secondary | ICD-10-CM | POA: Diagnosis not present

## 2021-07-21 DIAGNOSIS — D229 Melanocytic nevi, unspecified: Secondary | ICD-10-CM

## 2021-07-21 DIAGNOSIS — L814 Other melanin hyperpigmentation: Secondary | ICD-10-CM

## 2021-07-21 DIAGNOSIS — D3610 Benign neoplasm of peripheral nerves and autonomic nervous system, unspecified: Secondary | ICD-10-CM

## 2021-07-21 DIAGNOSIS — D1800 Hemangioma unspecified site: Secondary | ICD-10-CM

## 2021-07-21 NOTE — Patient Instructions (Signed)

## 2021-07-21 NOTE — Progress Notes (Signed)
New Patient Visit  Subjective  Benjamin Andrews is a 55 y.o. male who presents for the following: Other (Desires TBSE today - Family history of melanoma in father and sister). The patient presents for Total-Body Skin Exam (TBSE) for skin cancer screening and mole check.  The following portions of the chart were reviewed this encounter and updated as appropriate:   Tobacco  Allergies  Meds  Problems  Med Hx  Surg Hx  Fam Hx     Review of Systems:  No other skin or systemic complaints except as noted in HPI or Assessment and Plan.  Objective  Well appearing patient in no apparent distress; mood and affect are within normal limits.  A full examination was performed including scalp, head, eyes, ears, nose, lips, neck, chest, axillae, abdomen, back, buttocks, bilateral upper extremities, bilateral lower extremities, hands, feet, fingers, toes, fingernails, and toenails. All findings within normal limits unless otherwise noted below.  Mid Back 1.5 cm cystic papule of right upper back paraspinal  Left Upper Back Erythematous keratotic or waxy stuck-on papule or plaque.   Right mid back paraspinal 1.0 cm flesh colored papule  Legs Varicose veins with hyperpigmentation of lower legs  Left forearm x 2 (2) Verrucous papules -- Discussed viral etiology and contagion.    Assessment & Plan   Lentigines - Scattered tan macules - Due to sun exposure - Benign-appearing, observe - Recommend daily broad spectrum sunscreen SPF 30+ to sun-exposed areas, reapply every 2 hours as needed. - Call for any changes  Seborrheic Keratoses - Stuck-on, waxy, tan-brown papules and/or plaques  - Benign-appearing - Discussed benign etiology and prognosis. - Observe - Call for any changes  Melanocytic Nevi - Tan-brown and/or pink-flesh-colored symmetric macules and papules - Benign appearing on exam today - Observation - Call clinic for new or changing moles - Recommend daily use of broad  spectrum spf 30+ sunscreen to sun-exposed areas.   Hemangiomas - Red papules - Discussed benign nature - Observe - Call for any changes  Actinic Damage - Chronic condition, secondary to cumulative UV/sun exposure - diffuse scaly erythematous macules with underlying dyspigmentation - Recommend daily broad spectrum sunscreen SPF 30+ to sun-exposed areas, reapply every 2 hours as needed.  - Staying in the shade or wearing long sleeves, sun glasses (UVA+UVB protection) and wide brim hats (4-inch brim around the entire circumference of the hat) are also recommended for sun protection.  - Call for new or changing lesions.  Skin cancer screening performed today.  Epidermal inclusion cyst Mid Back Discussed excision. Patient may consider.  Inflamed seborrheic keratosis Left Upper Back Destruction of lesion - Left Upper Back Complexity: simple   Destruction method: cryotherapy   Informed consent: discussed and consent obtained   Timeout:  patient name, date of birth, surgical site, and procedure verified Lesion destroyed using liquid nitrogen: Yes   Region frozen until ice ball extended beyond lesion: Yes   Outcome: patient tolerated procedure well with no complications   Post-procedure details: wound care instructions given    Neurofibroma -growing Right mid back paraspinal Discussed excision. Patient may consider.  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   Benign.  Discussed BBL/laser treatments especially due to history of bleeding. Will consider.  Other viral warts (2) Left forearm x 2 Destruction of lesion - Left forearm x 2 Complexity: simple   Destruction method: cryotherapy   Informed consent:  discussed and consent obtained   Timeout:  patient name, date of birth, surgical site, and procedure verified Lesion destroyed using liquid  nitrogen: Yes   Region frozen until ice ball extended beyond lesion: Yes   Outcome: patient tolerated procedure well with no complications   Post-procedure details: wound care instructions given    Skin cancer screening  Return in about 2 months (around 09/20/2021) for ISK follow up.  I, Ashok Cordia, CMA, am acting as scribe for Sarina Ser, MD . Documentation: I have reviewed the above documentation for accuracy and completeness, and I agree with the above.  Sarina Ser, MD

## 2021-07-22 ENCOUNTER — Ambulatory Visit: Payer: No Typology Code available for payment source | Admitting: Dermatology

## 2021-07-24 ENCOUNTER — Encounter: Payer: Self-pay | Admitting: Family Medicine

## 2021-07-27 ENCOUNTER — Encounter: Payer: Self-pay | Admitting: Dermatology

## 2021-08-21 ENCOUNTER — Other Ambulatory Visit: Payer: Self-pay | Admitting: Family Medicine

## 2021-08-21 DIAGNOSIS — E782 Mixed hyperlipidemia: Secondary | ICD-10-CM

## 2021-09-03 ENCOUNTER — Other Ambulatory Visit: Payer: Self-pay

## 2021-09-03 MED ORDER — BUSPIRONE HCL 10 MG PO TABS: 10.0000 mg | ORAL_TABLET | Freq: Two times a day (BID) | ORAL | 1 refills | Status: DC

## 2021-09-22 ENCOUNTER — Ambulatory Visit: Payer: No Typology Code available for payment source | Admitting: Dermatology

## 2021-09-29 NOTE — Progress Notes (Signed)
PATIENT: Benjamin Andrews DOB: Sep 22, 1966  REASON FOR VISIT: follow up HISTORY FROM: patient Primary Neurologist: Dr. Krista Blue   HISTORY  Benjamin Andrews is a 55 year old male, seen in request by his primary care physician Dr. [name] for evaluation of seizure   I have reviewed and summarized the referring note from the referring physician.  He had a history of anxiety, taking Buspar 10mg  bid, anxiety, also history of nocturnal seizure, taking Trileptal 300mg  bid.used to have sleep apnea, he lost 20 pounds recently, is no longer using CPAP machine   I was able to review his previous neurologist's note, Dr. Alfonso Patten from [facility], most recent office note was on July 11, 2018, he has retired.  Patient reported a history of nocturnal seizure since 1998, there was a delayed diagnosis, he presented with right shoulder dislocation 3 times after woke up from overnight sleep, eventually had right shoulder surgery, shortly following his right shoulder surgery, he had a witnessed nocturnal generalized tonic-clonic seizure in 2000   Per patient, EEG, and MRI showed no significant abnormality, he was treated with a different antiepileptic medication, could not tolerated, then he was treated with Trileptal 150 mg 3 times a day, complains of daytime sleepiness, he has been on stable dose Trileptal 150mg  2 times a day for a long time, in 2015, in attempt to wean off Trileptal, he suffered another recurrent nocturnal seizure, woke up with whole-body muscle achy pain   He works as a Furniture conservator/restorer, does not want to have MRI of the brain, or EEG at this point   His maternal aunt, and maternal great grandmother suffered epilepsy,  Update October 01, 2020 SS: Here today for routine follow-up, continues to do well, last seizure was 3 years ago when he forgot to take his medication, has nocturnal seizures only. Knows had seizure because wakes up really sore.  Remains on Trileptal 300 mg twice a day.   Tolerates well.  Previously had MRI of the brain and EEG with prior neurologist.  Continues to do well.  Works as a Furniture conservator/restorer.  Update September 30, 2021 SS: Continues to do well, no recurrent seizure, remains on Trileptal 300 mg twice a day.  Tolerates well.  Continues to work full-time as a Furniture conservator/restorer, he has the rest of the year off.  Leaving today to travel to [facility] to see his ill mother.   REVIEW OF SYSTEMS: Out of a complete 14 system review of symptoms, the patient complains only of the following symptoms, and all other reviewed systems are negative.  N/A  ALLERGIES: No Known Allergies  HOME MEDICATIONS: Outpatient Medications Prior to Visit  Medication Sig Dispense Refill   busPIRone (BUSPAR) 10 MG tablet Take 1 tablet (10 mg total) by mouth 2 (two) times daily. 180 tablet 1   Multiple Vitamin (MULTIVITAMIN) tablet Take 1 tablet by mouth daily.     Omega-3 Fatty Acids (FISH OIL CONCENTRATE PO) Take by mouth.     rosuvastatin (CRESTOR) 20 MG tablet Take 1 tablet by mouth once daily 90 tablet 1   Oxcarbazepine (TRILEPTAL) 300 MG tablet Take 1 tablet (300 mg total) by mouth 2 (two) times daily. 180 tablet 4   No facility-administered medications prior to visit.    PAST MEDICAL HISTORY: Past Medical History:  Diagnosis Date   Allergic rhinitis    Blood in stool    Chickenpox    Headache    Hemorrhoids    High blood pressure    Seizures ([place])  Sleep apnea    UTI (lower urinary tract infection)     PAST SURGICAL HISTORY: Past Surgical History:  Procedure Laterality Date   broken nose     COLONOSCOPY WITH PROPOFOL N/A 12/04/2015   Procedure: COLONOSCOPY WITH PROPOFOL;  Surgeon: Manya Silvas, MD;  Location[facility] ENDOSCOPY;  Service: Endoscopy;  Laterality: N/A;   SHOULDER SURGERY      FAMILY HISTORY: Family History  Problem Relation Age of Onset   Alcoholism Other        Grandparent   Arthritis Other        Grandparent   Stroke Other        Parent    Diabetes Other        Grandparent   Rectal cancer Maternal Uncle    Cancer Mother        unsure - started in back   Healthy Father     SOCIAL HISTORY: Social History   Socioeconomic History   Marital status: Significant Other    Spouse name: Not on file   Number of children: 1   Years of education: some college   Highest education level: Not on file  Occupational History   Occupation: Furniture conservator/restorer  Tobacco Use   Smoking status: Never   Smokeless tobacco: Former  Substance and Sexual Activity   Alcohol use: Yes    Alcohol/week: 0.0 standard drinks    Comment: Infrequently   Drug use: No   Sexual activity: Not on file  Other Topics Concern   Not on file  Social History Narrative   Lives at home with his significant other.   Right-handed.   Two cups caffeine per day.   Social Determinants of Health   Financial Resource Strain: Not on file  Food Insecurity: Not on file  Transportation Needs: Not on file  Physical Activity: Not on file  Stress: Not on file  Social Connections: Not on file  Intimate Partner Violence: Not on file    PHYSICAL EXAM  Vitals:   09/30/21 0739  BP: 132/83  Pulse: 60  Weight: 243 lb (110.2 kg)  Height: 6\' 3"  (1.905 m)    Body mass index is 30.37 kg/m.  Generalized: Well developed, in no acute distress   Neurological examination  Mentation: Alert oriented to time, place, history taking. Follows all commands speech and language fluent, very pleasant Cranial nerve II-XII: Pupils were equal round reactive to light. Extraocular movements were full, visual field were full on confrontational test. Facial sensation and strength were normal.  Head turning and shoulder shrug  were normal and symmetric. Motor: The motor testing reveals 5 over 5 strength of all 4 extremities. Good symmetric motor tone is noted throughout.  Sensory: Sensory testing is intact to soft touch on all 4 extremities. No evidence of extinction is noted.  Coordination:  Cerebellar testing reveals good finger-nose-finger and heel-to-shin bilaterally.  Gait and station: Gait is normal. Tandem gait is normal.  Reflexes: Deep tendon reflexes are symmetric and normal bilaterally.   DIAGNOSTIC DATA (LABS, IMAGING, TESTING) - I reviewed patient records, labs, notes, testing and imaging myself where available.  Lab Results  Component Value Date   WBC 7.7 10/01/2020   HGB 16.7 10/01/2020   HCT 51.1 (H) 10/01/2020   MCV 92 10/01/2020   PLT 147 (L) 10/01/2020      Component Value Date/Time   NA 138 01/08/2021 1442   NA 142 10/01/2020 0807   K 4.7 01/08/2021 1442   CL 102 01/08/2021 1442  CO2 24 01/08/2021 1442   GLUCOSE 78 01/08/2021 1442   BUN 23 01/08/2021 1442   BUN 18 10/01/2020 0807   CREATININE 1.05 01/08/2021 1442   CALCIUM 9.4 01/08/2021 1442   PROT 6.7 01/08/2021 1442   PROT 6.9 10/01/2020 0807   ALBUMIN 4.7 10/01/2020 0807   AST 29 01/08/2021 1442   ALT 29 01/08/2021 1442   ALKPHOS 76 10/01/2020 0807   BILITOT 0.4 01/08/2021 1442   BILITOT 0.4 10/01/2020 0807   GFRNONAA 93 10/01/2020 0807   GFRAA 108 10/01/2020 0807   Lab Results  Component Value Date   CHOL 139 01/08/2021   HDL 36 (L) 01/08/2021   LDLCALC 83 01/08/2021   TRIG 103 01/08/2021   CHOLHDL 3.9 01/08/2021   Lab Results  Component Value Date   HGBA1C 5.3 01/08/2021   No results found for: VITAMINB12 Lab Results  Component Value Date   TSH 1.34 10/07/2015    ASSESSMENT AND PLAN 55 y.o. year old male  has a past medical history of Allergic rhinitis, Blood in stool, Chickenpox, Headache, Hemorrhoids, High blood pressure, Seizures ([place]), Sleep apnea, and UTI (lower urinary tract infection). here with:  1.  Epilepsy -Marden Noble is doing great, last seizure was about 4 years ago -Check routine labs today, continue Trileptal 300 mg twice a day -Call for any seizure activity, otherwise follow-up 1 year or sooner if needed, PCP will not assume refills, offered VV  Butler Denmark, AGNP-C, DNP 09/30/2021, 8:05 AM [facility] [address], [place], [place] [address] [phone]

## 2021-09-30 ENCOUNTER — Ambulatory Visit (INDEPENDENT_AMBULATORY_CARE_PROVIDER_SITE_OTHER): Payer: No Typology Code available for payment source | Admitting: Neurology

## 2021-09-30 ENCOUNTER — Encounter: Payer: Self-pay | Admitting: Neurology

## 2021-09-30 VITALS — BP 132/83 | HR 60 | Ht 75.0 in | Wt 243.0 lb

## 2021-09-30 DIAGNOSIS — R569 Unspecified convulsions: Secondary | ICD-10-CM | POA: Diagnosis not present

## 2021-09-30 MED ORDER — OXCARBAZEPINE 300 MG PO TABS: 300.0000 mg | ORAL_TABLET | Freq: Two times a day (BID) | ORAL | 4 refills | Status: DC

## 2021-09-30 NOTE — Patient Instructions (Signed)
Continue current medications  Call for any seizures See you back in 1 year

## 2021-10-02 LAB — CBC WITH DIFFERENTIAL/PLATELET
Basophils Absolute: 0 10*3/uL (ref 0.0–0.2)
Basos: 1 %
EOS (ABSOLUTE): 0.6 10*3/uL — ABNORMAL HIGH (ref 0.0–0.4)
Eos: 8 %
Hematocrit: 49.9 % (ref 37.5–51.0)
Hemoglobin: 16.5 g/dL (ref 13.0–17.7)
Immature Grans (Abs): 0 10*3/uL (ref 0.0–0.1)
Immature Granulocytes: 0 %
Lymphocytes Absolute: 2.4 10*3/uL (ref 0.7–3.1)
Lymphs: 35 %
MCH: 30 pg (ref 26.6–33.0)
MCHC: 33.1 g/dL (ref 31.5–35.7)
MCV: 91 fL (ref 79–97)
Monocytes Absolute: 0.6 10*3/uL (ref 0.1–0.9)
Monocytes: 8 %
Neutrophils Absolute: 3.2 10*3/uL (ref 1.4–7.0)
Neutrophils: 48 %
Platelets: 132 10*3/uL — ABNORMAL LOW (ref 150–450)
RBC: 5.5 x10E6/uL (ref 4.14–5.80)
RDW: 13 % (ref 11.6–15.4)
WBC: 6.8 10*3/uL (ref 3.4–10.8)

## 2021-10-02 LAB — COMPREHENSIVE METABOLIC PANEL
ALT: 38 IU/L (ref 0–44)
AST: 34 IU/L (ref 0–40)
Albumin/Globulin Ratio: 2.3 — ABNORMAL HIGH (ref 1.2–2.2)
Albumin: 4.8 g/dL (ref 3.8–4.9)
Alkaline Phosphatase: 83 IU/L (ref 44–121)
BUN/Creatinine Ratio: 16 (ref 9–20)
BUN: 15 mg/dL (ref 6–24)
Bilirubin Total: 0.3 mg/dL (ref 0.0–1.2)
CO2: 27 mmol/L (ref 20–29)
Calcium: 9.7 mg/dL (ref 8.7–10.2)
Chloride: 103 mmol/L (ref 96–106)
Creatinine, Ser: 0.94 mg/dL (ref 0.76–1.27)
Globulin, Total: 2.1 g/dL (ref 1.5–4.5)
Glucose: 97 mg/dL (ref 70–99)
Potassium: 5 mmol/L (ref 3.5–5.2)
Sodium: 142 mmol/L (ref 134–144)
Total Protein: 6.9 g/dL (ref 6.0–8.5)
eGFR: 95 mL/min/{1.73_m2} (ref >59)

## 2021-10-02 LAB — 10-HYDROXYCARBAZEPINE: Oxcarbazepine SerPl-Mcnc: 6 ug/mL — ABNORMAL LOW (ref 10–35)

## 2021-11-10 ENCOUNTER — Other Ambulatory Visit: Payer: Self-pay

## 2021-11-10 DIAGNOSIS — E782 Mixed hyperlipidemia: Secondary | ICD-10-CM

## 2021-11-10 DIAGNOSIS — R569 Unspecified convulsions: Secondary | ICD-10-CM

## 2021-11-10 MED ORDER — OXCARBAZEPINE 300 MG PO TABS: 300.0000 mg | ORAL_TABLET | Freq: Two times a day (BID) | ORAL | 4 refills | Status: DC

## 2021-11-10 MED ORDER — ROSUVASTATIN CALCIUM 20 MG PO TABS: 20.0000 mg | ORAL_TABLET | Freq: Every day | ORAL | 1 refills | Status: DC

## 2021-12-09 ENCOUNTER — Ambulatory Visit: Payer: No Typology Code available for payment source | Admitting: Dermatology

## 2022-01-14 ENCOUNTER — Encounter: Payer: Self-pay | Admitting: Family Medicine

## 2022-01-14 ENCOUNTER — Ambulatory Visit (INDEPENDENT_AMBULATORY_CARE_PROVIDER_SITE_OTHER): Payer: BC Managed Care – PPO | Admitting: Family Medicine

## 2022-01-14 VITALS — BP 119/80 | HR 60 | Temp 98.0°F | Ht 75.0 in | Wt 235.0 lb

## 2022-01-14 DIAGNOSIS — F411 Generalized anxiety disorder: Secondary | ICD-10-CM

## 2022-01-14 DIAGNOSIS — Z125 Encounter for screening for malignant neoplasm of prostate: Secondary | ICD-10-CM

## 2022-01-14 DIAGNOSIS — E7800 Pure hypercholesterolemia, unspecified: Secondary | ICD-10-CM

## 2022-01-14 DIAGNOSIS — Z0000 Encounter for general adult medical examination without abnormal findings: Secondary | ICD-10-CM

## 2022-01-14 DIAGNOSIS — E663 Overweight: Secondary | ICD-10-CM | POA: Diagnosis not present

## 2022-01-14 NOTE — Progress Notes (Signed)
?[name], MD ?Phone: [phone] ? ?Benjamin Andrews is a 56 y.o. male who presents today for CPE. ? ?Diet: follows his wifes diabetic diet ?Exercise: golfs, lifting some weights ?Colonoscopy: due ?Prostate cancer screening: due ?Family history- ? Prostate cancer: no ? Colon cancer: no, maternal uncle had rectal cancer ?Vaccines-  ? Flu: UTD ? Tetanus: UTD ? Shingles: due ? COVID19: UTD ?HIV screening: UTD ?Hep C Screening: UTD ?Tobacco use: no ?Alcohol use: rare ?Illicit Drug use: no ?Dentist: yes ?Ophthalmology: yes ?Notes anxiety is generally well controlled. Only takes the buspar one tablet 4x/week. ? ? ?Active Ambulatory Problems  ?  Diagnosis Date Noted  ? Generalized anxiety disorder 09/30/2015  ? Routine general medical examination at a health care facility 09/30/2015  ? Elevated LDL cholesterol level 12/30/2015  ? Erectile dysfunction 03/16/2016  ? Skin lesion of face 06/16/2016  ? Overweight 06/16/2016  ? Seizures ([place]) 10/14/2016  ? Strain of calf muscle 05/25/2017  ? Cervical radiculopathy 12/13/2019  ? ?Resolved Ambulatory Problems  ?  Diagnosis Date Noted  ? No Resolved Ambulatory Problems  ? ?Past Medical History:  ?Diagnosis Date  ? Allergic rhinitis   ? Blood in stool   ? Chickenpox   ? Headache   ? Hemorrhoids   ? High blood pressure   ? Sleep apnea   ? UTI (lower urinary tract infection)   ? ? ?Family History  ?Problem Relation Age of Onset  ? Alcoholism Other   ?     Grandparent  ? Arthritis Other   ?     Grandparent  ? Stroke Other   ?     Parent  ? Diabetes Other   ?     Grandparent  ? Rectal cancer Maternal Uncle   ? Cancer Mother   ?     unsure - started in back  ? Healthy Father   ? ? ?Social History  ? ?Socioeconomic History  ? Marital status: Married  ?  Spouse name: Not on file  ? Number of children: 1  ? Years of education: some college  ? Highest education level: Not on file  ?Occupational History  ? Occupation: Furniture conservator/restorer  ?Tobacco Use  ? Smoking status: Never  ? Smokeless  tobacco: Former  ?Substance and Sexual Activity  ? Alcohol use: Yes  ?  Alcohol/week: 0.0 standard drinks  ?  Comment: Infrequently  ? Drug use: No  ? Sexual activity: Not on file  ?Other Topics Concern  ? Not on file  ?Social History Narrative  ? Lives at home with his significant other.  ? Right-handed.  ? Two cups caffeine per day.  ? ?Social Determinants of Health  ? ?Financial Resource Strain: Not on file  ?Food Insecurity: Not on file  ?Transportation Needs: Not on file  ?Physical Activity: Not on file  ?Stress: Not on file  ?Social Connections: Not on file  ?Intimate Partner Violence: Not on file  ? ? ?ROS ? ?General:  Negative for nexplained weight loss, fever ?Skin: Negative for new or changing mole, sore that won't heal ?HEENT: Negative for trouble hearing, trouble seeing, ringing in ears, mouth sores, hoarseness, change in voice, dysphagia. ?CV:  Negative for chest pain, dyspnea, edema, palpitations ?Resp: Negative for cough, dyspnea, hemoptysis ?GI: Negative for nausea, vomiting, diarrhea, constipation, abdominal pain, melena, hematochezia. ?GU: Negative for dysuria, incontinence, urinary hesitance, hematuria, vaginal or penile discharge, polyuria, sexual difficulty, lumps in testicle or breasts ?MSK: Negative for muscle cramps or aches, joint pain or swelling ?  Neuro: Negative for headaches, weakness, numbness, dizziness, passing out/fainting ?Psych: Negative for depression, anxiety, memory problems ? ?Objective ? ?Physical Exam ?Vitals:  ? 01/14/22 1429  ?BP: 119/80  ?Pulse: 60  ?Temp: 98 ?F (36.7 ?C)  ?SpO2: 98%  ? ? ?BP Readings from Last 3 Encounters:  ?01/14/22 119/80  ?09/30/21 132/83  ?01/08/21 120/80  ? ?Wt Readings from Last 3 Encounters:  ?01/14/22 235 lb (106.6 kg)  ?09/30/21 243 lb (110.2 kg)  ?01/08/21 232 lb 6.4 oz (105.4 kg)  ? ? ?Physical Exam ?Constitutional:   ?   General: He is not in acute distress. ?   Appearance: He is not diaphoretic.  ?HENT:  ?   Head: Normocephalic and  atraumatic.  ?Eyes:  ?   Conjunctiva/sclera: Conjunctivae normal.  ?   Pupils: Pupils are equal, round, and reactive to light.  ?Cardiovascular:  ?   Rate and Rhythm: Normal rate and regular rhythm.  ?   Heart sounds: Normal heart sounds.  ?Pulmonary:  ?   Effort: Pulmonary effort is normal.  ?   Breath sounds: Normal breath sounds.  ?Abdominal:  ?   General: Bowel sounds are normal. There is no distension.  ?   Palpations: Abdomen is soft.  ?   Tenderness: There is no abdominal tenderness.  ?Musculoskeletal:  ?   Right lower leg: No edema.  ?   Left lower leg: No edema.  ?Lymphadenopathy:  ?   Cervical: No cervical adenopathy.  ?Skin: ?   General: Skin is warm and dry.  ?Neurological:  ?   Mental Status: He is alert.  ?Psychiatric:     ?   Mood and Affect: Mood normal.  ? ? ? ?Assessment/Plan:  ? ?Problem List Items Addressed This Visit   ? ? Elevated LDL cholesterol level  ? Relevant Orders  ? Lipid panel  ? Generalized anxiety disorder  ?  Well-controlled.  Discussed he could continue his BuSpar as he has or given that this is well controlled he could try to taper off with half a tablet to 4 days out of the week that he takes it for 1 month and then discontinuing it. ?  ?  ? Overweight  ? Relevant Orders  ? HgB A1c  ? Routine general medical examination at a health care facility - Primary  ?  Physical exam completed.  Encouraged healthy diet and exercise.  We will refer him for colonoscopy.  Discussed Shingrix vaccination.  He defers that today given that he does not want to be ill over the weekend for a wedding he has to go to.  He will get the Shingrix vaccine at the pharmacy at his request.  Lab work as outlined. ?  ?  ? ?Other Visit Diagnoses   ? ? Prostate cancer screening      ? Relevant Orders  ? PSA  ? ?  ? ? ?Return in about 1 year (around 01/15/2023) for CPE. ? ?This visit occurred during the SARS-CoV-2 public health emergency.  Safety protocols were in place, including screening questions prior to the  visit, additional usage of staff PPE, and extensive cleaning of exam room while observing appropriate contact time as indicated for disinfecting solutions.  ? ? ?[name], MD ?[place] ? ?

## 2022-01-14 NOTE — Patient Instructions (Signed)
Nice to see you. ?Please continue healthy diet and exercise. ?GI should contact you to schedule your colonoscopy. ?Please get the Shingrix vaccine at the pharmacy. ?We will contact you with your lab results. ?If you would like to come off of your BuSpar you can reduce it to half a tablet the days you have been taking it for 1 month and then discontinue it. ?

## 2022-01-14 NOTE — Assessment & Plan Note (Signed)
Physical exam completed.  Encouraged healthy diet and exercise.  We will refer him for colonoscopy.  Discussed Shingrix vaccination.  He defers that today given that he does not want to be ill over the weekend for a wedding he has to go to.  He will get the Shingrix vaccine at the pharmacy at his request.  Lab work as outlined. ?

## 2022-01-14 NOTE — Assessment & Plan Note (Signed)
Well-controlled.  Discussed he could continue his BuSpar as he has or given that this is well controlled he could try to taper off with half a tablet to 4 days out of the week that he takes it for 1 month and then discontinuing it. ?

## 2022-01-15 LAB — PSA: PSA: 2.54 ng/mL (ref <=4.00)

## 2022-01-15 LAB — HEMOGLOBIN A1C
Hgb A1c MFr Bld: 5.4 % of total Hgb (ref <5.7)
Mean Plasma Glucose: 108 mg/dL
eAG (mmol/L): 6 mmol/L

## 2022-01-15 LAB — LIPID PANEL
Cholesterol: 117 mg/dL (ref <200)
HDL: 35 mg/dL — ABNORMAL LOW (ref >=40)
LDL Cholesterol (Calc): 60 mg/dL (calc)
Non-HDL Cholesterol (Calc): 82 mg/dL (calc) (ref <130)
Total CHOL/HDL Ratio: 3.3 (calc) (ref <5.0)
Triglycerides: 140 mg/dL (ref <150)

## 2022-01-26 ENCOUNTER — Ambulatory Visit: Payer: No Typology Code available for payment source | Admitting: Dermatology

## 2022-02-11 ENCOUNTER — Encounter: Payer: Self-pay | Admitting: Family Medicine

## 2022-02-11 DIAGNOSIS — Z1211 Encounter for screening for malignant neoplasm of colon: Secondary | ICD-10-CM

## 2022-03-21 DIAGNOSIS — M9902 Segmental and somatic dysfunction of thoracic region: Secondary | ICD-10-CM | POA: Diagnosis not present

## 2022-03-21 DIAGNOSIS — M5033 Other cervical disc degeneration, cervicothoracic region: Secondary | ICD-10-CM | POA: Diagnosis not present

## 2022-03-21 DIAGNOSIS — M5412 Radiculopathy, cervical region: Secondary | ICD-10-CM | POA: Diagnosis not present

## 2022-03-21 DIAGNOSIS — M9901 Segmental and somatic dysfunction of cervical region: Secondary | ICD-10-CM | POA: Diagnosis not present

## 2022-03-23 DIAGNOSIS — M5033 Other cervical disc degeneration, cervicothoracic region: Secondary | ICD-10-CM | POA: Diagnosis not present

## 2022-03-23 DIAGNOSIS — M5412 Radiculopathy, cervical region: Secondary | ICD-10-CM | POA: Diagnosis not present

## 2022-03-23 DIAGNOSIS — M9901 Segmental and somatic dysfunction of cervical region: Secondary | ICD-10-CM | POA: Diagnosis not present

## 2022-03-23 DIAGNOSIS — M9902 Segmental and somatic dysfunction of thoracic region: Secondary | ICD-10-CM | POA: Diagnosis not present

## 2022-03-24 DIAGNOSIS — M5033 Other cervical disc degeneration, cervicothoracic region: Secondary | ICD-10-CM | POA: Diagnosis not present

## 2022-03-24 DIAGNOSIS — M9901 Segmental and somatic dysfunction of cervical region: Secondary | ICD-10-CM | POA: Diagnosis not present

## 2022-03-24 DIAGNOSIS — M9902 Segmental and somatic dysfunction of thoracic region: Secondary | ICD-10-CM | POA: Diagnosis not present

## 2022-03-24 DIAGNOSIS — M5412 Radiculopathy, cervical region: Secondary | ICD-10-CM | POA: Diagnosis not present

## 2022-03-25 DIAGNOSIS — M5412 Radiculopathy, cervical region: Secondary | ICD-10-CM | POA: Diagnosis not present

## 2022-03-25 DIAGNOSIS — M5033 Other cervical disc degeneration, cervicothoracic region: Secondary | ICD-10-CM | POA: Diagnosis not present

## 2022-03-25 DIAGNOSIS — M9901 Segmental and somatic dysfunction of cervical region: Secondary | ICD-10-CM | POA: Diagnosis not present

## 2022-03-25 DIAGNOSIS — M9902 Segmental and somatic dysfunction of thoracic region: Secondary | ICD-10-CM | POA: Diagnosis not present

## 2022-03-26 ENCOUNTER — Emergency Department
Admission: EM | Admit: 2022-03-26 | Discharge: 2022-03-26 | Disposition: A | Discharge status: Home / Self Care | Payer: BC Managed Care – PPO | Attending: Emergency Medicine | Admitting: Emergency Medicine

## 2022-03-26 ENCOUNTER — Emergency Department: Payer: BC Managed Care – PPO

## 2022-03-26 ENCOUNTER — Encounter: Payer: Self-pay | Admitting: Emergency Medicine

## 2022-03-26 ENCOUNTER — Other Ambulatory Visit: Payer: Self-pay

## 2022-03-26 DIAGNOSIS — M542 Cervicalgia: Secondary | ICD-10-CM | POA: Diagnosis not present

## 2022-03-26 DIAGNOSIS — M5412 Radiculopathy, cervical region: Secondary | ICD-10-CM | POA: Insufficient documentation

## 2022-03-26 IMAGING — MR MR CERVICAL SPINE W/O CM
5 series · 40 of 48 positions shown · non-contrast
Comparison: [DATE]

CLINICAL DATA: Chronic neck pain

EXAM:
MRI CERVICAL SPINE WITHOUT CONTRAST
TECHNIQUE: Multiplanar, multisequence MR imaging of the cervical spine was
performed. No intravenous contrast was administered.

[Series 5: T2 · sagittal · 3.0mm · 0.62mm/px · 7 of 15 slices shown (1 of 2)]
[im 1/15]
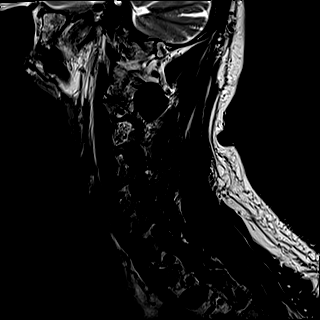
[im 3/15]
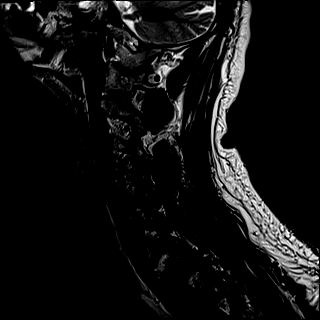
[im 5/15]
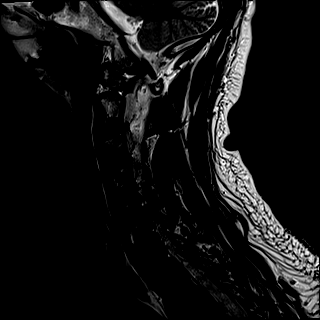
[im 8/15]
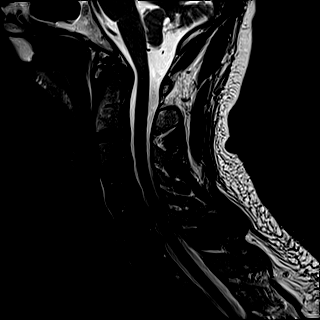
[im 10/15]
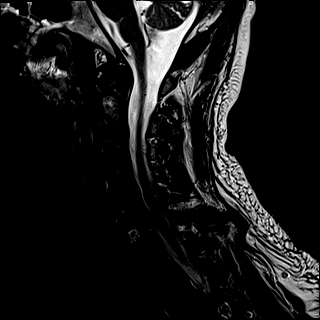
[im 12/15]
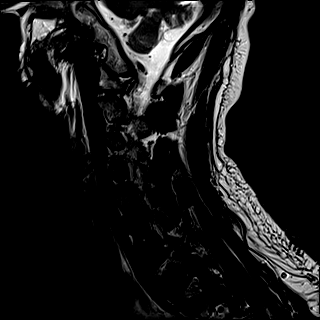
[im 15/15]
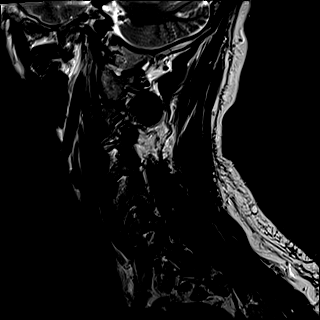

[Series 6: FLAIR · sagittal · 3.0mm · 0.78mm/px · 7 of 15 slices shown]
[im 1/15]
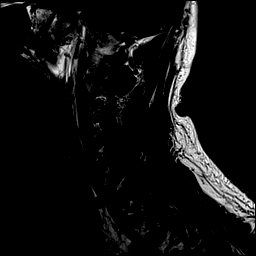
[im 3/15]
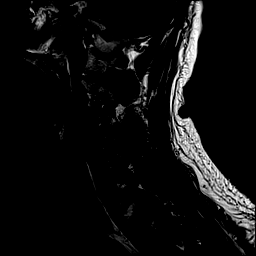
[im 5/15]
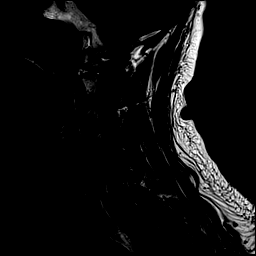
[im 8/15]
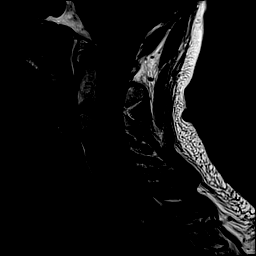
[im 10/15]
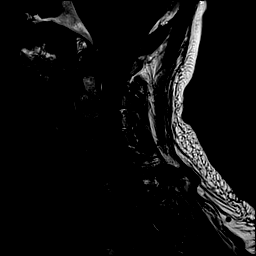
[im 12/15]
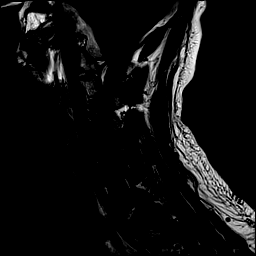
[im 15/15]
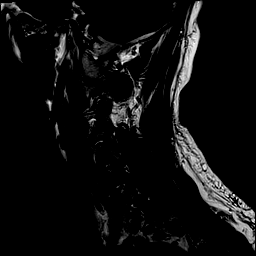

[Series 7: STIR · sagittal · 3.0mm · 0.62mm/px · 6 of 15 slices shown]
[im 1/15]
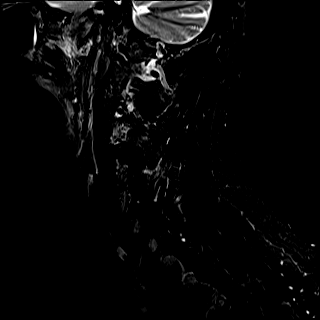
[im 3/15]
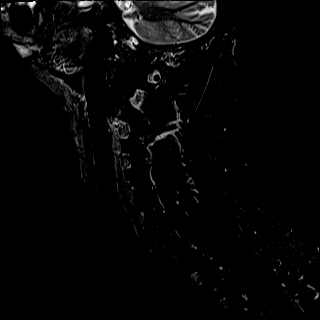
[im 6/15]
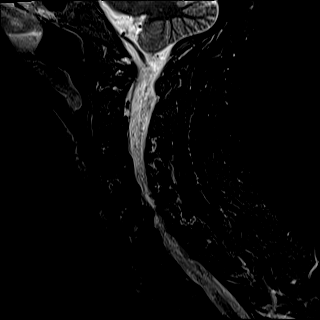
[im 9/15]
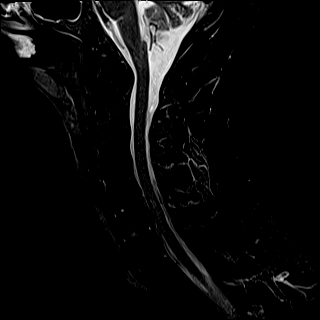
[im 12/15]
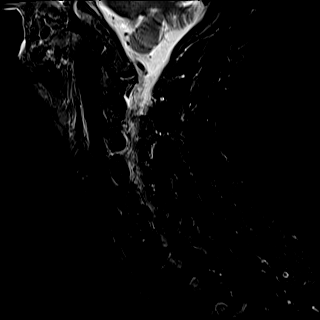
[im 15/15]
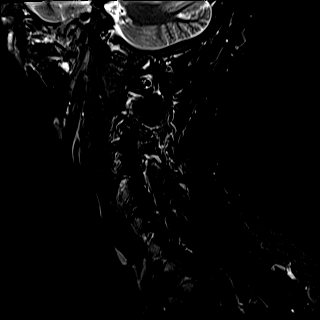

[Series 8: T2 · axial · 3.0mm · 0.70mm/px · z∈[-119,-17]mm · 12 of 33 slices shown (2 of 2)]
[im 1/33]
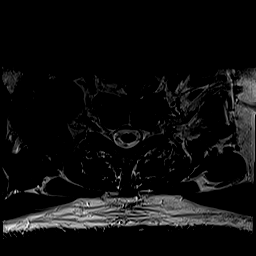
[im 3/33]
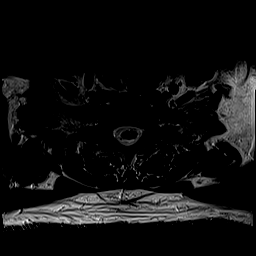
[im 5/33]
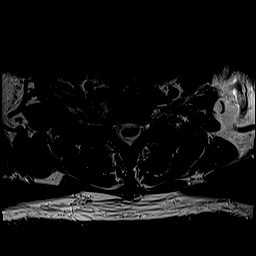
[im 8/33]
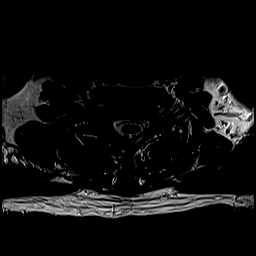
[im 10/33]
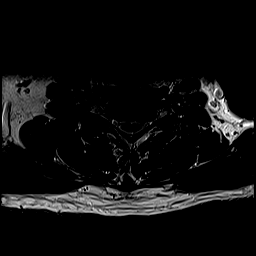
[im 13/33]
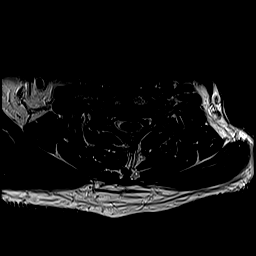
[im 15/33]
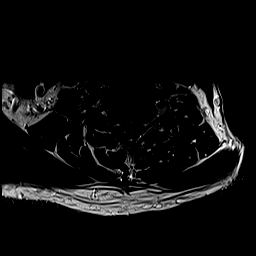
[im 18/33]
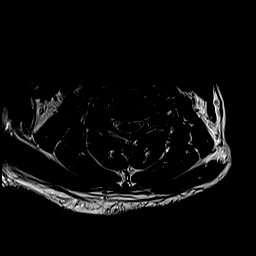
[im 20/33]
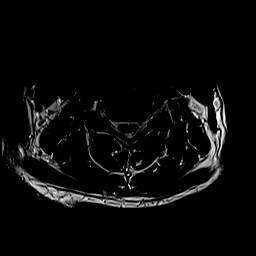
[im 23/33]
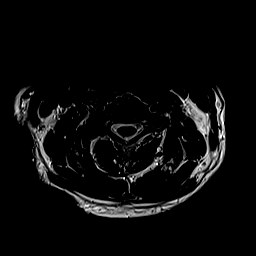
[im 28/33]
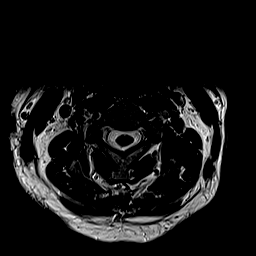
[im 33/33]
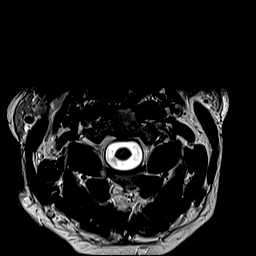

[Series 9: ax mpgr · axial · 3.0mm · 0.35mm/px · z∈[-119,-17]mm · 8 of 33 slices shown]
[im 1/33]
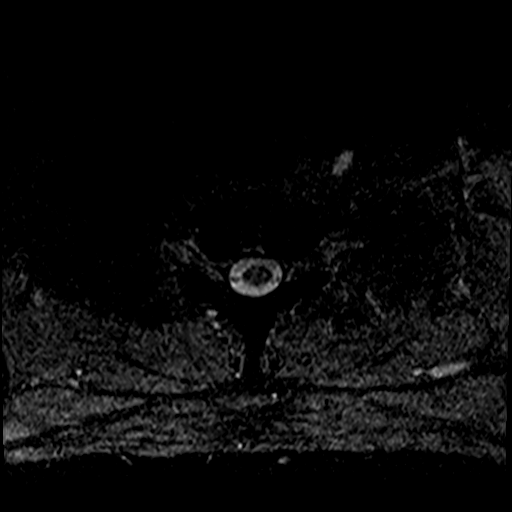
[im 5/33]
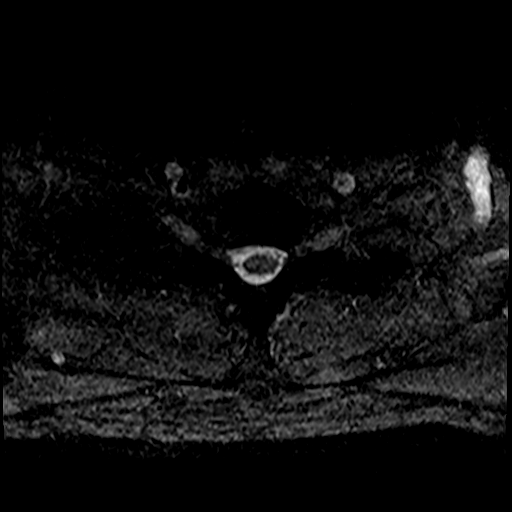
[im 10/33]
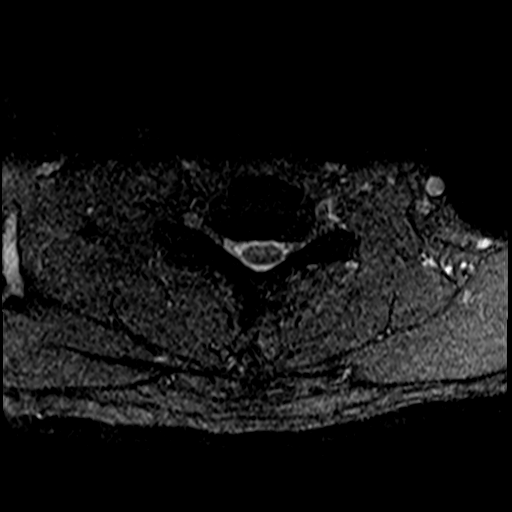
[im 15/33]
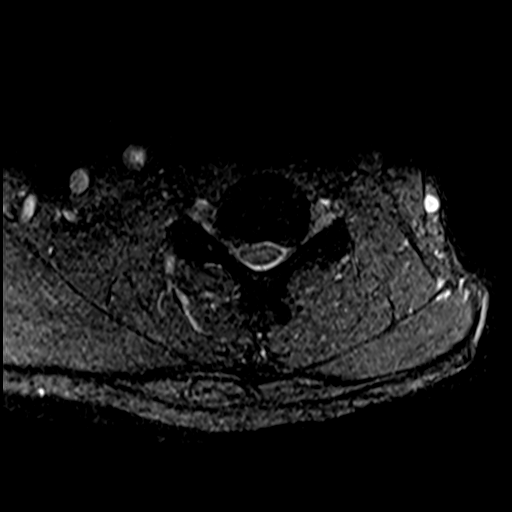
[im 18/33]
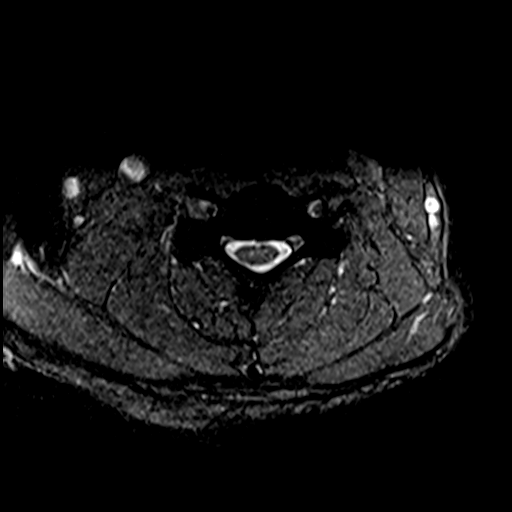
[im 23/33]
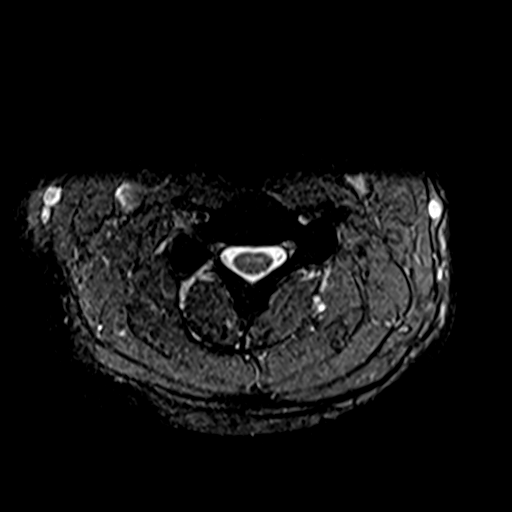
[im 28/33]
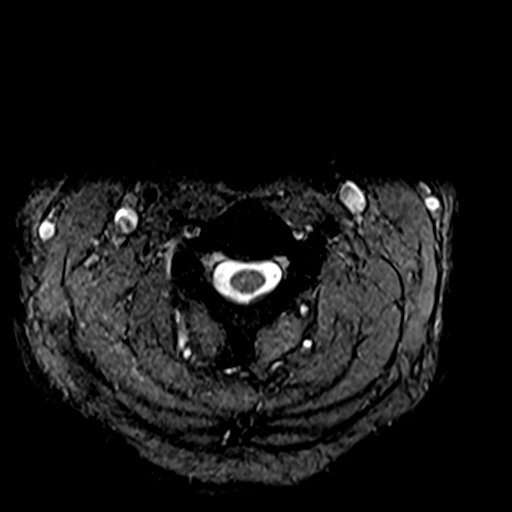
[im 33/33]
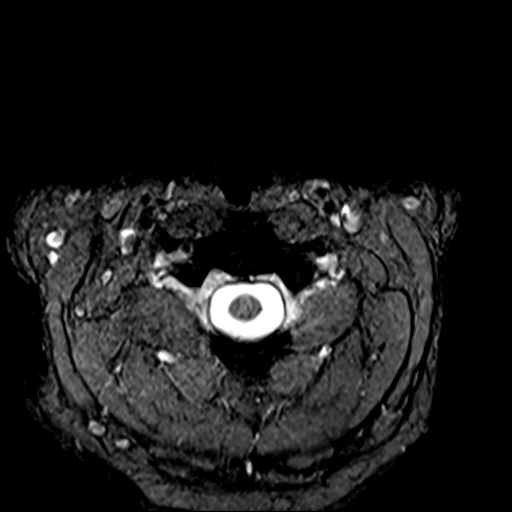

[40 of 48 positions shown; findings below may reference images not displayed]

FINDINGS: Alignment: Normal

Vertebrae: No fracture, evidence of discitis, or bone lesion.

Cord: Normal

Posterior Fossa, vertebral arteries, paraspinal tissues: Negative.

Disc levels:

C1-2: Unremarkable.

C2-3: Normal disc space and facet joints. There is no spinal canal
stenosis. No neural foraminal stenosis.

C3-4: Normal disc space and facet joints. There is no spinal canal
stenosis. No neural foraminal stenosis.

C4-5: Normal disc space and facet joints. There is no spinal canal
stenosis. No neural foraminal stenosis.

C5-6: Bilateral large uncovertebral osteophytes. Mild spinal canal
stenosis. Severe bilateral neural foraminal stenosis. This has
worsened on the right.

C6-7: Small left asymmetric disc bulge. There is no spinal canal
stenosis. Unchanged mild left neural foraminal stenosis.

C7-T1: Normal disc space and facet joints. There is no spinal canal
stenosis. No neural foraminal stenosis.
IMPRESSION: 1. Large uncovertebral osteophytes at C5-6 with mild spinal canal
stenosis and severe bilateral neural foraminal stenosis, worsened on
the right.
2. Otherwise normal cervical spine.

## 2022-03-26 MED ORDER — PREDNISONE 20 MG PO TABS: ORAL_TABLET | ORAL | 0 refills | Status: AC

## 2022-03-26 MED ORDER — GABAPENTIN 100 MG PO CAPS
ORAL_CAPSULE | ORAL | 0 refills | Status: DC
Start: 2022-03-26 — End: 2022-04-26

## 2022-03-26 MED ORDER — PREDNISONE 20 MG PO TABS
60.0000 mg | ORAL_TABLET | Freq: Once | ORAL | Status: AC
Start: 2022-03-26 — End: 2022-03-26
  Administered 2022-03-26: 60 mg via ORAL
  Filled 2022-03-26: qty 3

## 2022-03-26 MED ORDER — DIAZEPAM 5 MG PO TABS: 5.0000 mg | ORAL_TABLET | Freq: Every evening | ORAL | 0 refills | Status: DC | PRN

## 2022-03-26 MED ORDER — OXYCODONE-ACETAMINOPHEN 5-325 MG PO TABS: 1.0000 | ORAL_TABLET | Freq: Four times a day (QID) | ORAL | 0 refills | Status: AC | PRN

## 2022-03-26 MED ORDER — KETOROLAC TROMETHAMINE 60 MG/2ML IM SOLN
60.0000 mg | Freq: Once | INTRAMUSCULAR | Status: AC
  Administered 2022-03-26: 60 mg via INTRAMUSCULAR
  Filled 2022-03-26: qty 2

## 2022-03-26 MED ORDER — OXYCODONE HCL 5 MG PO TABS
5.0000 mg | ORAL_TABLET | Freq: Once | ORAL | Status: AC
  Administered 2022-03-26: 5 mg via ORAL
  Filled 2022-03-26: qty 1

## 2022-03-26 MED ORDER — ORPHENADRINE CITRATE 30 MG/ML IJ SOLN: 60.0000 mg | INTRAMUSCULAR | Status: DC

## 2022-03-26 MED ORDER — DIAZEPAM 5 MG PO TABS
5.0000 mg | ORAL_TABLET | Freq: Once | ORAL | Status: AC
  Administered 2022-03-26: 5 mg via ORAL
  Filled 2022-03-26: qty 1

## 2022-03-26 MED ORDER — OXYCODONE-ACETAMINOPHEN 5-325 MG PO TABS
1.0000 | ORAL_TABLET | Freq: Once | ORAL | Status: AC
  Administered 2022-03-26: 1 via ORAL
  Filled 2022-03-26: qty 1

## 2022-03-26 NOTE — ED Notes (Signed)
Pt A&O, pt given discharge instructions, pt ambulating with steady gait. 

## 2022-03-26 NOTE — ED Provider Notes (Signed)
-----------------------------------------   7:51 PM on 03/26/2022 -----------------------------------------  Blood pressure (!) 155/93, pulse 60, temperature (!) 97.4 F (36.3 C), temperature source Oral, resp. rate 18, height '6\' 4"'$  (1.93 m), weight 106.6 kg, SpO2 99 %.  Assuming care from Dr. Joni Fears.  In short, Benjamin Andrews is a 56 y.o. male with a chief complaint of Neck Pain .  Refer to the original H&P for additional details.  The current plan of care is to await MRI results and consult/refer to neurology for further management.  ____________________________________________    Labs Reviewed - No data to display ____________________________________________   ____________________________________________   RADIOLOGY  MR Cervical Spine Wo Contrast  Result Date: 03/26/2022 CLINICAL DATA:  Chronic neck pain EXAM: MRI CERVICAL SPINE WITHOUT CONTRAST TECHNIQUE: Multiplanar, multisequence MR imaging of the cervical spine was performed. No intravenous contrast was administered. COMPARISON:  12/17/2019 FINDINGS: Alignment: Normal Vertebrae: No fracture, evidence of discitis, or bone lesion. Cord: Normal Posterior Fossa, vertebral arteries, paraspinal tissues: Negative. Disc levels: C1-2: Unremarkable. C2-3: Normal disc space and facet joints. There is no spinal canal stenosis. No neural foraminal stenosis. C3-4: Normal disc space and facet joints. There is no spinal canal stenosis. No neural foraminal stenosis. C4-5: Normal disc space and facet joints. There is no spinal canal stenosis. No neural foraminal stenosis. C5-6: Bilateral large uncovertebral osteophytes. Mild spinal canal stenosis. Severe bilateral neural foraminal stenosis. This has worsened on the right. C6-7: Small left asymmetric disc bulge. There is no spinal canal stenosis. Unchanged mild left neural foraminal stenosis. C7-T1: Normal disc space and facet joints. There is no spinal canal stenosis. No neural foraminal stenosis.  IMPRESSION: 1. Large uncovertebral osteophytes at C5-6 with mild spinal canal stenosis and severe bilateral neural foraminal stenosis, worsened on the right. 2. Otherwise normal cervical spine. Electronically Signed   By: Ulyses Jarred M.D.   On: 03/26/2022 20:50   ____________________________________________  PROCEDURES  Toradol 60 mg IM Oxycodone IR 5 mg p.o. Prednisone 60 mg p.o.  Procedures ____________________________________________  INITIAL IMPRESSION / ASSESSMENT AND PLAN / ED COURSE   Patient with acute right upper extremity paresthesias and pain in the C5-C6 dermatomal distribution.  Symptoms are consistent with his MRI findings of significant foraminal stenosis on the right side.  ----------------------------------------- 9:27 PM on 03/26/2022 ----------------------------------------- Discussed the case with Elayne Guerin, he suggested a prednisone taper, and we will work to get the patient into his office next week for evaluation.  Patient and wife are agreeable to onset of the plan.  A copy of the MRI result is provided at the request.  Prescription for prednisone, Valium, gabapentin, and oxycodone provided to his local pharmacy. ____________________________________________  FINAL CLINICAL IMPRESSION(S) / ED DIAGNOSES  Final diagnoses:  Cervical radiculopathy      Carmie End, Dannielle Karvonen, PA-C 03/26/22 2318    Carrie Mew, MD 03/27/22 1927

## 2022-03-26 NOTE — ED Triage Notes (Signed)
Pt via POV from home. Pt neck pain that radiates down his R arm and L side lower back pain. Pt has a hx of degenerative disc unknown where. Pt states this has happened before and states he does a lot of strenuous work at his job. Pt states that it hurts him and takes his breath away when he moves in certain positions. Pt is A&Ox4 and NAD

## 2022-03-26 NOTE — Discharge Instructions (Addendum)
Take the prescription meds as directed.  Follow-up with Dr. Cari Caraway as discussed.

## 2022-03-26 NOTE — ED Provider Notes (Signed)
[facility] Provider Note    Event Date/Time   First MD Initiated Contact with Patient 03/26/22 1845     (approximate)   History   Chief Complaint: Neck Pain   HPI  Benjamin Andrews is a 56 y.o. 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  He has had constant throbbing sharp pain from the neck down to the fingertips.  Also reports motor weakness developing over the last several days in the right arm.  No headache or vision changes, no changes in balance or coordination.  No blunt trauma or sudden headaches or sudden tearing neck pain.  He has been trying ibuprofen and Tylenol at home without relief.     Physical Exam   Triage Vital Signs: ED Triage Vitals  Enc Vitals Group     BP 03/26/22 1723 (!) 155/93     Pulse Rate 03/26/22 1723 60     Resp 03/26/22 1723 18     Temp 03/26/22 1723 (!) 97.4 F (36.3 C)     Temp Source 03/26/22 1723 Oral     SpO2 03/26/22 1723 99 %     Weight 03/26/22 1722 235 lb (106.6 kg)     Height 03/26/22 1722 '6\' 4"'$  (1.93 m)     Head Circumference --      Peak Flow --      Pain Score 03/26/22 1720 6     Pain Loc --      Pain Edu? --      Excl. in [place]? --     Most recent vital signs: Vitals:   03/26/22 1723  BP: (!) 155/93  Pulse: 60  Resp: 18  Temp: (!) 97.4 F (36.3 C)  SpO2: 99%    General: Awake, no distress.  CV:  Good peripheral perfusion.  Regular rate rhythm Resp:  Normal effort.  Clear to auscultation bilaterally Abd:  No distention.  Other:  No midline spinal tenderness.  Patient has limited range of motion rotating his head to the right, which also reproduces his pain in the arm.  With lateral tilt of the head in this position and axial loading, symptoms are reproduced and exacerbated.  Strength in the right shoulder and right grip  are slightly diminished compared to the left but still very good.   ED Results / Procedures / Treatments   Labs (all labs ordered are listed, but only abnormal results are displayed) Labs Reviewed - No data to display   EKG    RADIOLOGY MRI cervical spine pending   PROCEDURES:  Procedures   MEDICATIONS ORDERED IN ED: Medications  oxyCODONE-acetaminophen (PERCOCET/ROXICET) 5-325 MG per tablet 1 tablet (1 tablet Oral Given 03/26/22 1900)  diazepam (VALIUM) tablet 5 mg (5 mg Oral Given 03/26/22 1900)     IMPRESSION / MDM / ASSESSMENT AND PLAN / ED COURSE  I reviewed the triage vital signs and the nursing notes.                              Differential diagnosis includes, but is not limited to, cervical radiculopathy, compression fracture, syrinx, nerve impingement  Patient's presentation is most consistent with acute presentation with potential threat to life or bodily function.  Patient presents with neck pain and neuropathic pain in  the right arm consistent with cervical radiculopathy.  With potential muscle weakness, will need to obtain an MRI to evaluate for a critical cord lesion.  Doubt stroke or intracranial hemorrhage or tumor.  If no severe findings on MRI, I think the patient can be discharged to follow-up with neurosurgery clinic.  I will start him on a low-dose of gabapentin along with short course of Percocet and Valium to use for pain control and help with comfort and sleep at night.     FINAL CLINICAL IMPRESSION(S) / ED DIAGNOSES   Final diagnoses:  Cervical radiculopathy     Rx / DC Orders   ED Discharge Orders          Ordered    oxyCODONE-acetaminophen (PERCOCET) 5-325 MG tablet  Every 6 hours PRN        03/26/22 1928    diazepam (VALIUM) 5 MG tablet  At bedtime PRN        03/26/22 1928    gabapentin (NEURONTIN) 100 MG capsule        03/26/22 1928             Note:  This document was prepared using Dragon voice recognition software  and may include unintentional dictation errors.   Carrie Mew, MD 03/26/22 [phone]

## 2022-03-30 DIAGNOSIS — M24011 Loose body in right shoulder: Secondary | ICD-10-CM | POA: Diagnosis not present

## 2022-03-30 DIAGNOSIS — M7541 Impingement syndrome of right shoulder: Secondary | ICD-10-CM | POA: Diagnosis not present

## 2022-03-30 DIAGNOSIS — M5412 Radiculopathy, cervical region: Secondary | ICD-10-CM | POA: Diagnosis not present

## 2022-03-30 DIAGNOSIS — M7551 Bursitis of right shoulder: Secondary | ICD-10-CM | POA: Diagnosis not present

## 2022-03-30 DIAGNOSIS — M25511 Pain in right shoulder: Secondary | ICD-10-CM | POA: Diagnosis not present

## 2022-04-01 ENCOUNTER — Other Ambulatory Visit: Payer: Self-pay | Admitting: Neurosurgery

## 2022-04-01 ENCOUNTER — Telehealth: Payer: Self-pay | Admitting: Family Medicine

## 2022-04-01 DIAGNOSIS — M25511 Pain in right shoulder: Secondary | ICD-10-CM

## 2022-04-01 NOTE — Telephone Encounter (Signed)
Pt wife called in requesting appointment/medication from Dr. Caryl Bis... Advised pt wife that Dr. Caryl Bis is not in the office today... Advised pt no avail appts for today... Advised pt a message can be sent to Dr. Caryl Bis and CMA... Pt wife hung up phone before I can advised to transfer to nurse line.Marland KitchenMarland Kitchen

## 2022-04-08 ENCOUNTER — Ambulatory Visit
Admission: RE | Admit: 2022-04-08 | Discharge: 2022-04-08 | Disposition: A | Discharge status: Home / Self Care | Payer: BC Managed Care – PPO | Source: Ambulatory Visit | Attending: Neurosurgery | Admitting: Neurosurgery

## 2022-04-08 DIAGNOSIS — M25511 Pain in right shoulder: Secondary | ICD-10-CM | POA: Diagnosis not present

## 2022-04-11 ENCOUNTER — Ambulatory Visit: Payer: BC Managed Care – PPO

## 2022-04-11 DIAGNOSIS — M4802 Spinal stenosis, cervical region: Secondary | ICD-10-CM | POA: Diagnosis not present

## 2022-04-11 DIAGNOSIS — M5412 Radiculopathy, cervical region: Secondary | ICD-10-CM | POA: Diagnosis not present

## 2022-04-18 DIAGNOSIS — M5412 Radiculopathy, cervical region: Secondary | ICD-10-CM | POA: Diagnosis not present

## 2022-04-25 DIAGNOSIS — M47812 Spondylosis without myelopathy or radiculopathy, cervical region: Secondary | ICD-10-CM | POA: Diagnosis not present

## 2022-04-25 DIAGNOSIS — M7581 Other shoulder lesions, right shoulder: Secondary | ICD-10-CM | POA: Diagnosis not present

## 2022-04-25 DIAGNOSIS — M5412 Radiculopathy, cervical region: Secondary | ICD-10-CM | POA: Diagnosis not present

## 2022-04-25 DIAGNOSIS — M19111 Post-traumatic osteoarthritis, right shoulder: Secondary | ICD-10-CM | POA: Diagnosis not present

## 2022-04-26 ENCOUNTER — Telehealth: Payer: Self-pay

## 2022-04-26 DIAGNOSIS — M5412 Radiculopathy, cervical region: Secondary | ICD-10-CM | POA: Diagnosis not present

## 2022-04-26 MED ORDER — GABAPENTIN 300 MG PO CAPS: 300.0000 mg | ORAL_CAPSULE | Freq: Three times a day (TID) | ORAL | 1 refills | Status: DC

## 2022-04-26 NOTE — Telephone Encounter (Signed)
Refill sent. Work note written.

## 2022-04-26 NOTE — Telephone Encounter (Signed)
-----   Message from Peggyann Shoals sent at 04/26/2022  2:48 PM EDT ----- Regarding: med refill/work note Contact: [phone] Gabapentin '300mg'$  3 times aday [facility]  He needs a work note to keep him out of work until his appt on 8/15, EMG on 8/7. He will access his work note on [employer].

## 2022-05-02 ENCOUNTER — Other Ambulatory Visit: Payer: Self-pay

## 2022-05-02 DIAGNOSIS — E782 Mixed hyperlipidemia: Secondary | ICD-10-CM

## 2022-05-02 MED ORDER — ROSUVASTATIN CALCIUM 20 MG PO TABS: 20.0000 mg | ORAL_TABLET | Freq: Every day | ORAL | 3 refills | Status: DC

## 2022-05-03 DIAGNOSIS — M5412 Radiculopathy, cervical region: Secondary | ICD-10-CM | POA: Diagnosis not present

## 2022-05-10 DIAGNOSIS — M5412 Radiculopathy, cervical region: Secondary | ICD-10-CM | POA: Diagnosis not present

## 2022-05-17 DIAGNOSIS — M5412 Radiculopathy, cervical region: Secondary | ICD-10-CM | POA: Diagnosis not present

## 2022-05-23 DIAGNOSIS — R202 Paresthesia of skin: Secondary | ICD-10-CM | POA: Diagnosis not present

## 2022-05-23 DIAGNOSIS — R200 Anesthesia of skin: Secondary | ICD-10-CM | POA: Diagnosis not present

## 2022-05-31 ENCOUNTER — Other Ambulatory Visit: Payer: Self-pay

## 2022-05-31 ENCOUNTER — Encounter: Payer: Self-pay | Admitting: Neurosurgery

## 2022-05-31 ENCOUNTER — Ambulatory Visit (INDEPENDENT_AMBULATORY_CARE_PROVIDER_SITE_OTHER): Payer: BC Managed Care – PPO | Admitting: Neurosurgery

## 2022-05-31 VITALS — BP 146/86 | Ht 76.0 in | Wt 249.8 lb

## 2022-05-31 DIAGNOSIS — M5412 Radiculopathy, cervical region: Secondary | ICD-10-CM

## 2022-05-31 DIAGNOSIS — Z01818 Encounter for other preprocedural examination: Secondary | ICD-10-CM

## 2022-05-31 DIAGNOSIS — R29898 Other symptoms and signs involving the musculoskeletal system: Secondary | ICD-10-CM

## 2022-05-31 DIAGNOSIS — M4802 Spinal stenosis, cervical region: Secondary | ICD-10-CM

## 2022-05-31 NOTE — H&P (View-Only) (Signed)
Referring Physician:  Leone Haven, MD [place],  [place] [address]  Primary Physician:  Leone Haven, MD  History of Present Illness: 05/31/2022 [name] is here today with a chief complaint of continued right arm discomfort and weakness.  He had an injection which helped a little bit.  He has been doing physical therapy without improvement.  03/30/2022 [name] is here today with a chief complaint of pain in his right shoulder that radiates his right upper arm. He also reports numbness and tingling in his right wrist and thumb, index, and middle finger. He feels like he is going to drop items that he picks up with his right hand.  This happened on May 30, after which he began seeing his chiropractor. For the last week he has had the numbness.   He was lifting a heavy item at work after which she began having pain. He has previously had numbness in his hands a couple of years ago. He has no numbness higher up above his wrist. He has no forearm pain. He had shoulder injury for which she had surgery approximately 25 years ago. He reports achy, throbbing, and sharp pain as bad as 8 out of 10. Laying down and constant movement make it worse. Bending forward makes it slightly better.  Bowel/Bladder Dysfunction: none  Conservative measures: chiropractic therapy, cold, TENS/eletric stim Physical therapy: has not participated Multimodal medical therapy including regular antiinflammatories: diazepam, gabapentin, percocet, prednisone, tylenol, ibuprofen Injections: has not received epidural steroid injections  Past Surgery: denies  Benjamin Andrews has no symptoms of cervical myelopathy.  The symptoms are causing a significant impact on the patient's life.     Review of Systems:  A 10 point review of systems is negative, except for the pertinent positives and negatives detailed in the HPI.  Past Medical History: Past Medical History:   Diagnosis Date   Allergic rhinitis    Blood in stool    Chickenpox    Headache    Hemorrhoids    High blood pressure    Seizures (HCC)    Sleep apnea    UTI (lower urinary tract infection)     Past Surgical History: Past Surgical History:  Procedure Laterality Date   broken nose     COLONOSCOPY WITH PROPOFOL N/A 12/04/2015   Procedure: COLONOSCOPY WITH PROPOFOL;  Surgeon: Manya Silvas, MD;  Location: [facility] ENDOSCOPY;  Service: Endoscopy;  Laterality: N/A;   SHOULDER SURGERY      Allergies: Allergies as of 05/31/2022   (No Known Allergies)    Medications: Current Meds  Medication Sig   busPIRone (BUSPAR) 10 MG tablet Take 1 tablet (10 mg total) by mouth 2 (two) times daily.   gabapentin (NEURONTIN) 300 MG capsule Take 1 capsule (300 mg total) by mouth 3 (three) times daily.   Multiple Vitamin (MULTIVITAMIN) tablet Take 1 tablet by mouth daily.   Omega-3 Fatty Acids (FISH OIL CONCENTRATE PO) Take by mouth.   Oxcarbazepine (TRILEPTAL) 300 MG tablet Take 1 tablet (300 mg total) by mouth 2 (two) times daily.   rosuvastatin (CRESTOR) 20 MG tablet Take 1 tablet (20 mg total) by mouth daily.    Social History: Social History   Tobacco Use   Smoking status: Never   Smokeless tobacco: Former  Substance Use Topics   Alcohol use: Yes    Alcohol/week: 0.0 standard drinks of alcohol    Comment: Infrequently   Drug use: No  Family Medical History: Family History  Problem Relation Age of Onset   Alcoholism Other        Grandparent   Arthritis Other        Grandparent   Stroke Other        Parent   Diabetes Other        Grandparent   Rectal cancer Maternal Uncle    Cancer Mother        unsure - started in back   Healthy Father     Physical Examination: Vitals:   05/31/22 1521  BP: (!) 146/86    General: Patient is well developed, well nourished, calm, collected, and in no apparent distress. Attention to examination is appropriate.  Neck:   Supple.   Limited rotation to R on range of motion.  Respiratory: Patient is breathing without any difficulty.   NEUROLOGICAL:     Awake, alert, oriented to person, place, and time.  Speech is clear and fluent. Fund of knowledge is appropriate.   Cranial Nerves: Pupils equal round and reactive to light.  Facial tone is symmetric.  Facial sensation is symmetric. Shoulder shrug is symmetric. Tongue protrusion is midline.  There is no pronator drift.  ROM of spine: full.    Strength: Side Biceps Triceps Deltoid Interossei Grip Wrist Ext. Wrist Flex.  R 4- '5 5 5 5 5 5  '$ L '5 5 5 5 5 5 5   '$ Side Iliopsoas Quads Hamstring PF DF EHL  R '5 5 5 5 5 5  '$ L '5 5 5 5 5 5   '$ Reflexes are 1+ and symmetric at the biceps, triceps, brachioradialis, patella and achilles.   Hoffman's is absent.  Clonus is not present.  Toes are down-going.  Bilateral upper and lower extremity sensation is intact to light touch.    No evidence of dysmetria noted.  Gait is normal.    Medical Decision Making  Imaging: MRI C spine 03/26/22 IMPRESSION: 1. Large uncovertebral osteophytes at C5-6 with mild spinal canal stenosis and severe bilateral neural foraminal stenosis, worsened on the right. 2. Otherwise normal cervical spine.     Electronically Signed   By: Ulyses Jarred M.D.   On: 03/26/2022 20:50   I have personally reviewed the images and agree with the above interpretation.  Nerve conduction study from May 23, 2022 shows acute and active right C6 radiculopathy.  Assessment and Plan: Benjamin Andrews is a pleasant 56 y.o. 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  He has weakness in his right biceps which was not present when I previously saw him.  He has tried and failed physical therapy.  At this point, it is clear to me that he is suffering from a right C6 radiculopathy.  He has tried and failed conservative management.  Given his weakness and failure of prior  conservative management, I do not think that further conservative management is indicated.  I have recommended an anterior cervical discectomy and fusion at C5-6.   I discussed the planned procedure at length with the patient, including the risks, benefits, alternatives, and indications. The risks discussed include but are not limited to bleeding, infection, need for reoperation, spinal fluid leak, stroke, vision loss, anesthetic complication, coma, paralysis, and even death. We also discussed the possibility of post-operative dysphagia, vocal cord paralysis, and the risk of adjacent segment disease in the future. I also described in detail that improvement was not guaranteed.  The patient expressed understanding of  these risks, and asked that we proceed with surgery. I described the surgery in layman's terms, and gave ample opportunity for questions, which were answered to the best of my ability.   I spent a total of 30 minutes in face-to-face and non-face-to-face activities related to this patient's care today.  Thank you for involving me in the care of this patient.      Nathalia Wismer K. Izora Ribas MD, [facility] Neurosurgery

## 2022-05-31 NOTE — Progress Notes (Signed)
Referring Physician:  Leone Haven, MD [place] [place],  [place] [address]  Primary Physician:  Leone Haven, MD  History of Present Illness: 05/31/2022 [name] is here today with a chief complaint of continued right arm discomfort and weakness.  He had an injection which helped a little bit.  He has been doing physical therapy without improvement.  03/30/2022 [name] is here today with a chief complaint of pain in his right shoulder that radiates his right upper arm. He also reports numbness and tingling in his right wrist and thumb, index, and middle finger. He feels like he is going to drop items that he picks up with his right hand.  This happened on May 30, after which he began seeing his chiropractor. For the last week he has had the numbness.   He was lifting a heavy item at work after which she began having pain. He has previously had numbness in his hands a couple of years ago. He has no numbness higher up above his wrist. He has no forearm pain. He had shoulder injury for which she had surgery approximately 25 years ago. He reports achy, throbbing, and sharp pain as bad as 8 out of 10. Laying down and constant movement make it worse. Bending forward makes it slightly better.  Bowel/Bladder Dysfunction: none  Conservative measures: chiropractic therapy, cold, TENS/eletric stim Physical therapy: has not participated Multimodal medical therapy including regular antiinflammatories: diazepam, gabapentin, percocet, prednisone, tylenol, ibuprofen Injections: has not received epidural steroid injections  Past Surgery: denies  Benjamin Andrews has no symptoms of cervical myelopathy.  The symptoms are causing a significant impact on the patient's life.     Review of Systems:  A 10 point review of systems is negative, except for the pertinent positives and negatives detailed in the HPI.  Past Medical History: Past Medical History:   Diagnosis Date   Allergic rhinitis    Blood in stool    Chickenpox    Headache    Hemorrhoids    High blood pressure    Seizures (HCC)    Sleep apnea    UTI (lower urinary tract infection)     Past Surgical History: Past Surgical History:  Procedure Laterality Date   broken nose     COLONOSCOPY WITH PROPOFOL N/A 12/04/2015   Procedure: COLONOSCOPY WITH PROPOFOL;  Surgeon: Manya Silvas, MD;  Location: [facility] ENDOSCOPY;  Service: Endoscopy;  Laterality: N/A;   SHOULDER SURGERY      Allergies: Allergies as of 05/31/2022   (No Known Allergies)    Medications: Current Meds  Medication Sig   busPIRone (BUSPAR) 10 MG tablet Take 1 tablet (10 mg total) by mouth 2 (two) times daily.   gabapentin (NEURONTIN) 300 MG capsule Take 1 capsule (300 mg total) by mouth 3 (three) times daily.   Multiple Vitamin (MULTIVITAMIN) tablet Take 1 tablet by mouth daily.   Omega-3 Fatty Acids (FISH OIL CONCENTRATE PO) Take by mouth.   Oxcarbazepine (TRILEPTAL) 300 MG tablet Take 1 tablet (300 mg total) by mouth 2 (two) times daily.   rosuvastatin (CRESTOR) 20 MG tablet Take 1 tablet (20 mg total) by mouth daily.    Social History: Social History   Tobacco Use   Smoking status: Never   Smokeless tobacco: Former  Substance Use Topics   Alcohol use: Yes    Alcohol/week: 0.0 standard drinks of alcohol    Comment: Infrequently   Drug use: No  Family Medical History: Family History  Problem Relation Age of Onset   Alcoholism Other        Grandparent   Arthritis Other        Grandparent   Stroke Other        Parent   Diabetes Other        Grandparent   Rectal cancer Maternal Uncle    Cancer Mother        unsure - started in back   Healthy Father     Physical Examination: Vitals:   05/31/22 1521  BP: (!) 146/86    General: Patient is well developed, well nourished, calm, collected, and in no apparent distress. Attention to examination is appropriate.  Neck:   Supple.   Limited rotation to R on range of motion.  Respiratory: Patient is breathing without any difficulty.   NEUROLOGICAL:     Awake, alert, oriented to person, place, and time.  Speech is clear and fluent. Fund of knowledge is appropriate.   Cranial Nerves: Pupils equal round and reactive to light.  Facial tone is symmetric.  Facial sensation is symmetric. Shoulder shrug is symmetric. Tongue protrusion is midline.  There is no pronator drift.  ROM of spine: full.    Strength: Side Biceps Triceps Deltoid Interossei Grip Wrist Ext. Wrist Flex.  R 4- '5 5 5 5 5 5  '$ L '5 5 5 5 5 5 5   '$ Side Iliopsoas Quads Hamstring PF DF EHL  R '5 5 5 5 5 5  '$ L '5 5 5 5 5 5   '$ Reflexes are 1+ and symmetric at the biceps, triceps, brachioradialis, patella and achilles.   Hoffman's is absent.  Clonus is not present.  Toes are down-going.  Bilateral upper and lower extremity sensation is intact to light touch.    No evidence of dysmetria noted.  Gait is normal.    Medical Decision Making  Imaging: MRI C spine 03/26/22 IMPRESSION: 1. Large uncovertebral osteophytes at C5-6 with mild spinal canal stenosis and severe bilateral neural foraminal stenosis, worsened on the right. 2. Otherwise normal cervical spine.     Electronically Signed   By: Ulyses Jarred M.D.   On: 03/26/2022 20:50   I have personally reviewed the images and agree with the above interpretation.  Nerve conduction study from May 23, 2022 shows acute and active right C6 radiculopathy.  Assessment and Plan: Benjamin Andrews is a pleasant 56 y.o. 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  He has weakness in his right biceps which was not present when I previously saw him.  He has tried and failed physical therapy.  At this point, it is clear to me that he is suffering from a right C6 radiculopathy.  He has tried and failed conservative management.  Given his weakness and failure of prior  conservative management, I do not think that further conservative management is indicated.  I have recommended an anterior cervical discectomy and fusion at C5-6.   I discussed the planned procedure at length with the patient, including the risks, benefits, alternatives, and indications. The risks discussed include but are not limited to bleeding, infection, need for reoperation, spinal fluid leak, stroke, vision loss, anesthetic complication, coma, paralysis, and even death. We also discussed the possibility of post-operative dysphagia, vocal cord paralysis, and the risk of adjacent segment disease in the future. I also described in detail that improvement was not guaranteed.  The patient expressed understanding of  these risks, and asked that we proceed with surgery. I described the surgery in layman's terms, and gave ample opportunity for questions, which were answered to the best of my ability.   I spent a total of 30 minutes in face-to-face and non-face-to-face activities related to this patient's care today.  Thank you for involving me in the care of this patient.      Benjamin Andrews K. Izora Ribas MD, [facility] Neurosurgery

## 2022-05-31 NOTE — Patient Instructions (Signed)
Please see below for information in regards to your upcoming surgery:  Planned surgery: C5-6 anterior cervical discectomy and fusion   Surgery date: 06/13/22 - you will find out your arrival time the business day before your surgery.   NSAIDS (Non-steroidal anti-inflammatory drugs): because you are having a fusion, no NSAIDS (such as ibuprofen, aleve, naproxen, meloxicam, diclofenac) for 3 months after surgery. Celebrex is an exception. Tylenol is ok because it is not an NSAID.   Pre-op appointment at [facility]: we will call you with a date/time for this. Pre-admit testing is located on the first floor of the [facility], [place], [address].   Pre-op labs may be done at your pre-op appointment. You are not required to fast for these labs.    Should you need to change your pre-op appointment, please call Pre-admit testing at [phone].     Because you are having a fusion: for appointments after your 2 week follow-up: please arrive at the [facility] outpatient imaging center ([place], [place]) or [facility] one hour prior to your appointment for x-rays. This applies to every appointment after your 2 week follow-up. Failure to do so may result in your appointment being rescheduled.   If you have FMLA/disability paperwork, please drop it off or fax it to [phone], attention [place].   If you have any questions/concerns before or after surgery, you can reach [place] at [phone], or you can send a mychart message. If you have a concern after hours that cannot wait until normal business hours, you can call [phone] or [phone] and ask the answering service to page the neurosurgeon on call.   Appointments/FMLA & disability paperwork: [place] Nurse: Ophelia Shoulder  Medical assistant: Raquel Sarna Physician Assistant's: Cooper Render & Geronimo Boot Surgeon: Meade Maw, MD

## 2022-06-06 ENCOUNTER — Encounter
Admission: RE | Admit: 2022-06-06 | Discharge: 2022-06-06 | Disposition: A | Discharge status: Home / Self Care | Payer: BC Managed Care – PPO | Source: Ambulatory Visit | Attending: Neurosurgery | Admitting: Neurosurgery

## 2022-06-06 DIAGNOSIS — E663 Overweight: Secondary | ICD-10-CM | POA: Diagnosis not present

## 2022-06-06 DIAGNOSIS — R569 Unspecified convulsions: Secondary | ICD-10-CM | POA: Insufficient documentation

## 2022-06-06 DIAGNOSIS — Z01818 Encounter for other preprocedural examination: Secondary | ICD-10-CM | POA: Insufficient documentation

## 2022-06-06 DIAGNOSIS — Z01812 Encounter for preprocedural laboratory examination: Secondary | ICD-10-CM

## 2022-06-06 DIAGNOSIS — M5412 Radiculopathy, cervical region: Secondary | ICD-10-CM | POA: Diagnosis not present

## 2022-06-06 DIAGNOSIS — R030 Elevated blood-pressure reading, without diagnosis of hypertension: Secondary | ICD-10-CM | POA: Diagnosis not present

## 2022-06-06 HISTORY — DX: Generalized anxiety disorder: F41.1

## 2022-06-06 HISTORY — DX: Radiculopathy, cervical region: M54.12

## 2022-06-06 HISTORY — DX: Angina pectoris, unspecified: I20.9

## 2022-06-06 LAB — CBC
HCT: 45.8 % (ref 39.0–52.0)
Hemoglobin: 15.4 g/dL (ref 13.0–17.0)
MCH: 30.1 pg (ref 26.0–34.0)
MCHC: 33.6 g/dL (ref 30.0–36.0)
MCV: 89.6 fL (ref 80.0–100.0)
Platelets: 145 10*3/uL — ABNORMAL LOW (ref 150–400)
RBC: 5.11 MIL/uL (ref 4.22–5.81)
RDW: 12.9 % (ref 11.5–15.5)
WBC: 6.4 10*3/uL (ref 4.0–10.5)
nRBC: 0 % (ref 0.0–0.2)

## 2022-06-06 LAB — BASIC METABOLIC PANEL
Anion gap: 6 (ref 5–15)
BUN: 14 mg/dL (ref 6–20)
CO2: 29 mmol/L (ref 22–32)
Calcium: 9.3 mg/dL (ref 8.9–10.3)
Chloride: 106 mmol/L (ref 98–111)
Creatinine, Ser: 0.87 mg/dL (ref 0.61–1.24)
GFR, Estimated: >60 mL/min (ref >60)
Glucose, Bld: 89 mg/dL (ref 70–99)
Potassium: 3.9 mmol/L (ref 3.5–5.1)
Sodium: 141 mmol/L (ref 135–145)

## 2022-06-06 LAB — TYPE AND SCREEN
ABO/RH(D): O POS
Antibody Screen: NEGATIVE

## 2022-06-06 LAB — SURGICAL PCR SCREEN
MRSA, PCR: NEGATIVE
Staphylococcus aureus: NEGATIVE

## 2022-06-06 NOTE — Patient Instructions (Addendum)
Your procedure is scheduled on: Monday, August 28 Report to the Registration Desk on the 1st floor of the [facility]. To find out your arrival time, please call [phone] between 1PM - 3PM on: Friday, August 25 If your arrival time is 6:00 am, do not arrive prior to that time as the [place] entrance doors do not open until 6:00 am.  REMEMBER: Instructions that are not followed completely may result in serious medical risk, up to and including death; or upon the discretion of your surgeon and anesthesiologist your surgery may need to be rescheduled.  Do not eat food after midnight the night before surgery.  No gum chewing, lozengers or hard candies.  You may however, drink CLEAR liquids up to 2 hours before you are scheduled to arrive for your surgery. Do not drink anything within 2 hours of your scheduled arrival time.  Clear liquids include: - water  - apple juice without pulp - gatorade (not RED colors) - black coffee or tea (Do NOT add milk or creamers to the coffee or tea) Do NOT drink anything that is not on this list.  TAKE THESE MEDICATIONS THE MORNING OF SURGERY WITH A SIP OF WATER:  Buspirone Gabapentin Oxcarbazepine (Trileptal) Rosuvastatin  One week prior to surgery: starting August 21 Stop Anti-inflammatories (NSAIDS) such as Advil, Aleve, Ibuprofen, Motrin, Naproxen, Naprosyn and Aspirin based products such as Excedrin, Goodys Powder, BC Powder. Stop ANY OVER THE COUNTER supplements until after surgery. You may however, continue to take Tylenol if needed for pain up until the day of surgery.  No Alcohol for 24 hours before or after surgery.  No Smoking including e-cigarettes for 24 hours prior to surgery.  No chewable tobacco products for at least 6 hours prior to surgery.  No nicotine patches on the day of surgery.  Do not use any "recreational" drugs for at least a week prior to your surgery.  Please be advised that the combination of cocaine and  anesthesia may have negative outcomes, up to and including death. If you test positive for cocaine, your surgery will be cancelled.  On the morning of surgery brush your teeth with toothpaste and water, you may rinse your mouth with mouthwash if you wish. Do not swallow any toothpaste or mouthwash.  Use CHG Soap as directed on instruction sheet.  Do not wear jewelry, make-up, hairpins, clips or nail polish.  Do not wear lotions, powders, or perfumes.   Do not shave body from the neck down 48 hours prior to surgery just in case you cut yourself which could leave a site for infection.  Also, freshly shaved skin may become irritated if using the CHG soap.  Contact lenses, hearing aids and dentures may not be worn into surgery.  Do not bring valuables to the hospital. [facility] is not responsible for any missing/lost belongings or valuables.   Notify your doctor if there is any change in your medical condition (cold, fever, infection).  Wear comfortable clothing (specific to your surgery type) to the hospital.  After surgery, you can help prevent lung complications by doing breathing exercises.  Take deep breaths and cough every 1-2 hours. Your doctor may order a device called an Incentive Spirometer to help you take deep breaths.  If you are being discharged the day of surgery, you will not be allowed to drive home. You will need a responsible adult (18 years or older) to drive you home and stay with you that night.   If  you are taking public transportation, you will need to have a responsible adult (18 years or older) with you. Please confirm with your physician that it is acceptable to use public transportation.   Please call the [place] Dept. at [phone] if you have any questions about these instructions.  Surgery Visitation Policy:  Patients undergoing a surgery or procedure may have two family members or support persons with them as long as the person is  not COVID-19 positive or experiencing its symptoms.   Preparing for Surgery with [place] (CHG) Soap    Before surgery, you can play an important role by reducing the number of germs on your skin.  CHG (Chlorhexidine gluconate) soap is an antiseptic cleanser which kills germs and bonds with the skin to continue killing germs even after washing.  Please do not use if you have an allergy to CHG or antibacterial soaps. If your skin becomes reddened/irritated stop using the CHG.  1. Shower the NIGHT BEFORE SURGERY and the MORNING OF SURGERY with CHG soap.  2. If you choose to wash your hair, wash your hair first as usual with your normal shampoo.  3. After shampooing, rinse your hair and body thoroughly to remove the shampoo.  4. Use CHG as you would any other liquid soap. You can apply CHG directly to the skin and wash gently with a scrungie or a clean washcloth.  5. Apply the CHG soap to your body only from the neck down. Do not use on open wounds or open sores. Avoid contact with your eyes, ears, mouth, and genitals (private parts). Wash face and genitals (private parts) with your normal soap.  6. Wash thoroughly, paying special attention to the area where your surgery will be performed.  7. Thoroughly rinse your body with warm water.  8. Do not shower/wash with your normal soap after using and rinsing off the CHG soap.  9. Pat yourself dry with a clean towel.  10. Wear clean pajamas to bed the night before surgery.  12. Place clean sheets on your bed the night of your first shower and do not sleep with pets.  13. Shower again with the CHG soap on the day of surgery prior to arriving at the hospital.  14. Do not apply any deodorants/lotions/powders.  15. Please wear clean clothes to the hospital.

## 2022-06-13 ENCOUNTER — Encounter: Payer: Self-pay | Admitting: Neurosurgery

## 2022-06-13 ENCOUNTER — Other Ambulatory Visit: Payer: Self-pay

## 2022-06-13 ENCOUNTER — Encounter
Admission: RE | Disposition: A | Discharge status: Home / Self Care | Payer: Self-pay | Source: Ambulatory Visit | Attending: Neurosurgery

## 2022-06-13 ENCOUNTER — Ambulatory Visit: Payer: BC Managed Care – PPO | Admitting: Certified Registered Nurse Anesthetist

## 2022-06-13 ENCOUNTER — Ambulatory Visit: Payer: BC Managed Care – PPO | Admitting: Urgent Care

## 2022-06-13 ENCOUNTER — Ambulatory Visit
Admission: RE | Admit: 2022-06-13 | Discharge: 2022-06-13 | Disposition: A | Discharge status: Home / Self Care | Payer: BC Managed Care – PPO | Source: Ambulatory Visit | Attending: Neurosurgery | Admitting: Neurosurgery

## 2022-06-13 ENCOUNTER — Ambulatory Visit: Payer: BC Managed Care – PPO

## 2022-06-13 DIAGNOSIS — M5412 Radiculopathy, cervical region: Secondary | ICD-10-CM | POA: Diagnosis not present

## 2022-06-13 DIAGNOSIS — X500XXA Overexertion from strenuous movement or load, initial encounter: Secondary | ICD-10-CM | POA: Diagnosis not present

## 2022-06-13 DIAGNOSIS — Z6830 Body mass index (BMI) 30.0-30.9, adult: Secondary | ICD-10-CM | POA: Diagnosis not present

## 2022-06-13 DIAGNOSIS — E669 Obesity, unspecified: Secondary | ICD-10-CM | POA: Insufficient documentation

## 2022-06-13 DIAGNOSIS — Z87891 Personal history of nicotine dependence: Secondary | ICD-10-CM | POA: Diagnosis not present

## 2022-06-13 DIAGNOSIS — Z01812 Encounter for preprocedural laboratory examination: Secondary | ICD-10-CM

## 2022-06-13 DIAGNOSIS — M2578 Osteophyte, vertebrae: Secondary | ICD-10-CM | POA: Diagnosis not present

## 2022-06-13 DIAGNOSIS — I10 Essential (primary) hypertension: Secondary | ICD-10-CM | POA: Insufficient documentation

## 2022-06-13 DIAGNOSIS — G4730 Sleep apnea, unspecified: Secondary | ICD-10-CM | POA: Insufficient documentation

## 2022-06-13 DIAGNOSIS — Z8619 Personal history of other infectious and parasitic diseases: Secondary | ICD-10-CM | POA: Insufficient documentation

## 2022-06-13 DIAGNOSIS — R29898 Other symptoms and signs involving the musculoskeletal system: Secondary | ICD-10-CM | POA: Insufficient documentation

## 2022-06-13 DIAGNOSIS — Y990 Civilian activity done for income or pay: Secondary | ICD-10-CM | POA: Insufficient documentation

## 2022-06-13 DIAGNOSIS — R569 Unspecified convulsions: Secondary | ICD-10-CM | POA: Diagnosis not present

## 2022-06-13 DIAGNOSIS — E663 Overweight: Secondary | ICD-10-CM

## 2022-06-13 DIAGNOSIS — Z01818 Encounter for other preprocedural examination: Secondary | ICD-10-CM

## 2022-06-13 DIAGNOSIS — M4322 Fusion of spine, cervical region: Secondary | ICD-10-CM | POA: Diagnosis not present

## 2022-06-13 HISTORY — PX: ANTERIOR CERVICAL DECOMP/DISCECTOMY FUSION: SHX1161

## 2022-06-13 LAB — ABO/RH: ABO/RH(D): O POS

## 2022-06-13 SURGERY — ANTERIOR CERVICAL DECOMPRESSION/DISCECTOMY FUSION 1 LEVEL
Anesthesia: General | Site: Neck

## 2022-06-13 MED ORDER — EPHEDRINE SULFATE (PRESSORS) 50 MG/ML IJ SOLN
INTRAMUSCULAR | Status: DC | PRN
  Administered 2022-06-13: 5 mg via INTRAVENOUS

## 2022-06-13 MED ORDER — ONDANSETRON HCL 4 MG/2ML IJ SOLN
INTRAMUSCULAR | Status: AC
  Filled 2022-06-13: qty 2

## 2022-06-13 MED ORDER — REMIFENTANIL HCL 1 MG IV SOLR
INTRAVENOUS | Status: AC
  Filled 2022-06-13: qty 1000

## 2022-06-13 MED ORDER — FENTANYL CITRATE (PF) 100 MCG/2ML IJ SOLN
25.0000 ug | INTRAMUSCULAR | Status: AC | PRN
  Administered 2022-06-13: 50 ug via INTRAVENOUS
  Administered 2022-06-13 (×2): 25 ug via INTRAVENOUS
  Administered 2022-06-13: 50 ug via INTRAVENOUS

## 2022-06-13 MED ORDER — DEXAMETHASONE SODIUM PHOSPHATE 10 MG/ML IJ SOLN
INTRAMUSCULAR | Status: AC
  Filled 2022-06-13: qty 1

## 2022-06-13 MED ORDER — OXYCODONE HCL 5 MG/5ML PO SOLN: 5.0000 mg | Freq: Once | ORAL | Status: AC | PRN

## 2022-06-13 MED ORDER — DEXAMETHASONE SODIUM PHOSPHATE 10 MG/ML IJ SOLN
INTRAMUSCULAR | Status: DC | PRN
  Administered 2022-06-13: 10 mg via INTRAVENOUS

## 2022-06-13 MED ORDER — ONDANSETRON HCL 4 MG/2ML IJ SOLN
INTRAMUSCULAR | Status: DC | PRN
  Administered 2022-06-13: 4 mg via INTRAVENOUS

## 2022-06-13 MED ORDER — 0.9 % SODIUM CHLORIDE (POUR BTL) OPTIME
TOPICAL | Status: DC | PRN
  Administered 2022-06-13: 500 mL

## 2022-06-13 MED ORDER — PHENYLEPHRINE HCL-NACL 20-0.9 MG/250ML-% IV SOLN
INTRAVENOUS | Status: AC
  Filled 2022-06-13: qty 250

## 2022-06-13 MED ORDER — OXYCODONE-ACETAMINOPHEN 5-325 MG PO TABS: 1.0000 | ORAL_TABLET | ORAL | 0 refills | Status: AC | PRN

## 2022-06-13 MED ORDER — GLYCOPYRROLATE 0.2 MG/ML IJ SOLN
INTRAMUSCULAR | Status: DC | PRN
  Administered 2022-06-13: .2 mg via INTRAVENOUS

## 2022-06-13 MED ORDER — MIDAZOLAM HCL 2 MG/2ML IJ SOLN
INTRAMUSCULAR | Status: AC
  Filled 2022-06-13: qty 2

## 2022-06-13 MED ORDER — LIDOCAINE HCL (CARDIAC) PF 100 MG/5ML IV SOSY
PREFILLED_SYRINGE | INTRAVENOUS | Status: DC | PRN
  Administered 2022-06-13: 100 mg via INTRAVENOUS

## 2022-06-13 MED ORDER — EPHEDRINE 5 MG/ML INJ
INTRAVENOUS | Status: AC
  Filled 2022-06-13: qty 5

## 2022-06-13 MED ORDER — MIDAZOLAM HCL 2 MG/2ML IJ SOLN
INTRAMUSCULAR | Status: DC | PRN
  Administered 2022-06-13: 2 mg via INTRAVENOUS

## 2022-06-13 MED ORDER — SODIUM CHLORIDE 0.9 % IV SOLN
INTRAVENOUS | Status: DC | PRN
  Administered 2022-06-13: .08 ug/kg/min via INTRAVENOUS

## 2022-06-13 MED ORDER — SUCCINYLCHOLINE CHLORIDE 200 MG/10ML IV SOSY
PREFILLED_SYRINGE | INTRAVENOUS | Status: AC
  Filled 2022-06-13: qty 10

## 2022-06-13 MED ORDER — BUPIVACAINE-EPINEPHRINE (PF) 0.5% -1:200000 IJ SOLN
INTRAMUSCULAR | Status: DC | PRN
  Administered 2022-06-13: 4 mL

## 2022-06-13 MED ORDER — ACETAMINOPHEN 10 MG/ML IV SOLN
INTRAVENOUS | Status: AC
  Filled 2022-06-13: qty 200

## 2022-06-13 MED ORDER — PROPOFOL 10 MG/ML IV BOLUS
INTRAVENOUS | Status: DC | PRN
  Administered 2022-06-13: 200 mg via INTRAVENOUS

## 2022-06-13 MED ORDER — CHLORHEXIDINE GLUCONATE 0.12 % MT SOLN
OROMUCOSAL | Status: AC
  Administered 2022-06-13: 15 mL via OROMUCOSAL
  Filled 2022-06-13: qty 15

## 2022-06-13 MED ORDER — GLYCOPYRROLATE 0.2 MG/ML IJ SOLN
INTRAMUSCULAR | Status: AC
  Filled 2022-06-13: qty 1

## 2022-06-13 MED ORDER — DROPERIDOL 2.5 MG/ML IJ SOLN: 0.6250 mg | Freq: Once | INTRAMUSCULAR | Status: DC | PRN

## 2022-06-13 MED ORDER — OXYCODONE HCL 5 MG PO TABS
ORAL_TABLET | ORAL | Status: AC
  Filled 2022-06-13: qty 1

## 2022-06-13 MED ORDER — METHOCARBAMOL 500 MG PO TABS
500.0000 mg | ORAL_TABLET | Freq: Four times a day (QID) | ORAL | 0 refills | Status: DC | PRN
Start: 2022-06-13 — End: 2022-06-28

## 2022-06-13 MED ORDER — FENTANYL CITRATE (PF) 100 MCG/2ML IJ SOLN
INTRAMUSCULAR | Status: DC | PRN
  Administered 2022-06-13 (×2): 50 ug via INTRAVENOUS

## 2022-06-13 MED ORDER — OXYCODONE HCL 5 MG PO TABS
5.0000 mg | ORAL_TABLET | Freq: Once | ORAL | Status: AC | PRN
  Administered 2022-06-13: 5 mg via ORAL

## 2022-06-13 MED ORDER — FAMOTIDINE 20 MG PO TABS: 20.0000 mg | ORAL_TABLET | Freq: Once | ORAL | Status: AC

## 2022-06-13 MED ORDER — BUPIVACAINE-EPINEPHRINE (PF) 0.5% -1:200000 IJ SOLN
INTRAMUSCULAR | Status: AC
  Filled 2022-06-13: qty 30

## 2022-06-13 MED ORDER — FENTANYL CITRATE (PF) 100 MCG/2ML IJ SOLN
INTRAMUSCULAR | Status: AC
  Administered 2022-06-13: 50 ug via INTRAVENOUS
  Filled 2022-06-13: qty 2

## 2022-06-13 MED ORDER — FENTANYL CITRATE (PF) 100 MCG/2ML IJ SOLN
INTRAMUSCULAR | Status: AC
  Administered 2022-06-13: 25 ug via INTRAVENOUS
  Filled 2022-06-13: qty 2

## 2022-06-13 MED ORDER — CEFAZOLIN SODIUM-DEXTROSE 2-4 GM/100ML-% IV SOLN
2.0000 g | INTRAVENOUS | Status: AC
  Administered 2022-06-13: 2 g via INTRAVENOUS

## 2022-06-13 MED ORDER — CEFAZOLIN IN SODIUM CHLORIDE 2-0.9 GM/100ML-% IV SOLN
2.0000 g | Freq: Once | INTRAVENOUS | Status: DC
  Filled 2022-06-13: qty 100

## 2022-06-13 MED ORDER — ACETAMINOPHEN 10 MG/ML IV SOLN: 1000.0000 mg | Freq: Once | INTRAVENOUS | Status: DC | PRN

## 2022-06-13 MED ORDER — CEFAZOLIN SODIUM-DEXTROSE 2-4 GM/100ML-% IV SOLN
INTRAVENOUS | Status: AC
  Filled 2022-06-13: qty 100

## 2022-06-13 MED ORDER — ORAL CARE MOUTH RINSE: 15.0000 mL | Freq: Once | OROMUCOSAL | Status: AC

## 2022-06-13 MED ORDER — LIDOCAINE HCL (PF) 2 % IJ SOLN
INTRAMUSCULAR | Status: AC
  Filled 2022-06-13: qty 5

## 2022-06-13 MED ORDER — SENNA 8.6 MG PO TABS: 1.0000 | ORAL_TABLET | Freq: Every day | ORAL | 0 refills | Status: DC | PRN

## 2022-06-13 MED ORDER — SURGIFLO WITH THROMBIN (HEMOSTATIC MATRIX KIT) OPTIME
TOPICAL | Status: DC | PRN
  Administered 2022-06-13: 1 via TOPICAL

## 2022-06-13 MED ORDER — METHOCARBAMOL 1000 MG/10ML IJ SOLN
500.0000 mg | Freq: Once | INTRAMUSCULAR | Status: AC
Start: 2022-06-13 — End: 2022-06-13
  Administered 2022-06-13: 500 mg via INTRAVENOUS
  Filled 2022-06-13: qty 500

## 2022-06-13 MED ORDER — CHLORHEXIDINE GLUCONATE 0.12 % MT SOLN: 15.0000 mL | Freq: Once | OROMUCOSAL | Status: AC

## 2022-06-13 MED ORDER — KETOROLAC TROMETHAMINE 30 MG/ML IJ SOLN
INTRAMUSCULAR | Status: DC | PRN
  Administered 2022-06-13: 30 mg via INTRAVENOUS

## 2022-06-13 MED ORDER — PROMETHAZINE HCL 25 MG/ML IJ SOLN: 6.2500 mg | INTRAMUSCULAR | Status: DC | PRN

## 2022-06-13 MED ORDER — SUCCINYLCHOLINE CHLORIDE 200 MG/10ML IV SOSY
PREFILLED_SYRINGE | INTRAVENOUS | Status: DC | PRN
  Administered 2022-06-13: 120 mg via INTRAVENOUS

## 2022-06-13 MED ORDER — ACETAMINOPHEN 10 MG/ML IV SOLN
INTRAVENOUS | Status: DC | PRN
  Administered 2022-06-13: 1000 mg via INTRAVENOUS

## 2022-06-13 MED ORDER — LACTATED RINGERS IV SOLN: INTRAVENOUS | Status: DC

## 2022-06-13 MED ORDER — FENTANYL CITRATE (PF) 100 MCG/2ML IJ SOLN
INTRAMUSCULAR | Status: AC
  Filled 2022-06-13: qty 2

## 2022-06-13 MED ORDER — FAMOTIDINE 20 MG PO TABS
ORAL_TABLET | ORAL | Status: AC
  Administered 2022-06-13: 20 mg via ORAL
  Filled 2022-06-13: qty 1

## 2022-06-13 MED ORDER — PROPOFOL 1000 MG/100ML IV EMUL
INTRAVENOUS | Status: AC
  Filled 2022-06-13: qty 200

## 2022-06-13 SURGICAL SUPPLY — 58 items
ADH SKN CLS APL DERMABOND .7 (GAUZE/BANDAGES/DRESSINGS) ×1
AGENT HMST KT MTR STRL THRMB (HEMOSTASIS) ×1
ALLOGRAFT BONE FIBER KORE 1CC (Bone Implant) IMPLANT
BASIN KIT SINGLE STR (MISCELLANEOUS) ×1 IMPLANT
BASKET BONE COLLECTION (BASKET) IMPLANT
BULB RESERV EVAC DRAIN JP 100C (MISCELLANEOUS) IMPLANT
BUR NEURO DRILL SOFT 3.0X3.8M (BURR) ×1 IMPLANT
DERMABOND ADVANCED (GAUZE/BANDAGES/DRESSINGS) ×1
DERMABOND ADVANCED .7 DNX12 (GAUZE/BANDAGES/DRESSINGS) ×1 IMPLANT
DRAIN CHANNEL JP 10F RND 20C F (MISCELLANEOUS) IMPLANT
DRAPE C ARM PK CFD 31 SPINE (DRAPES) ×1 IMPLANT
DRAPE LAPAROTOMY 77X122 PED (DRAPES) ×1 IMPLANT
DRAPE MICROSCOPE SPINE 48X150 (DRAPES) ×1 IMPLANT
DRAPE SURG 17X11 SM STRL (DRAPES) ×1 IMPLANT
ELECT REM PT RETURN 9FT ADLT (ELECTROSURGICAL) ×1
ELECTRODE REM PT RTRN 9FT ADLT (ELECTROSURGICAL) ×1 IMPLANT
FEE INTRAOP CADWELL SUPPLY NCS (MISCELLANEOUS) IMPLANT
FEE INTRAOP MONITOR IMPULS NCS (MISCELLANEOUS) IMPLANT
GLOVE BIOGEL PI IND STRL 6.5 (GLOVE) ×1 IMPLANT
GLOVE BIOGEL PI IND STRL 8.5 (GLOVE) ×1 IMPLANT
GLOVE BIOGEL PI INDICATOR 6.5 (GLOVE) ×1
GLOVE BIOGEL PI INDICATOR 8.5 (GLOVE) ×1
GLOVE SURG SYN 6.5 ES PF (GLOVE) ×1 IMPLANT
GLOVE SURG SYN 6.5 PF PI (GLOVE) ×1 IMPLANT
GLOVE SURG SYN 8.5  E (GLOVE) ×3
GLOVE SURG SYN 8.5 E (GLOVE) ×3 IMPLANT
GLOVE SURG SYN 8.5 PF PI (GLOVE) ×3 IMPLANT
GOWN SRG LRG LVL 4 IMPRV REINF (GOWNS) ×1 IMPLANT
GOWN SRG XL LVL 3 NONREINFORCE (GOWNS) ×1 IMPLANT
GOWN STRL NON-REIN TWL XL LVL3 (GOWNS) ×1
GOWN STRL REIN LRG LVL4 (GOWNS) ×1
INTRAOP CADWELL SUPPLY FEE NCS (MISCELLANEOUS)
INTRAOP DISP SUPPLY FEE NCS (MISCELLANEOUS)
INTRAOP MONITOR FEE IMPULS NCS (MISCELLANEOUS)
INTRAOP MONITOR FEE IMPULSE (MISCELLANEOUS)
KIT TURNOVER KIT A (KITS) ×1 IMPLANT
MANIFOLD NEPTUNE II (INSTRUMENTS) ×1 IMPLANT
NDL SAFETY ECLIPSE 18X1.5 (NEEDLE) ×1 IMPLANT
NEEDLE HYPO 18GX1.5 SHARP (NEEDLE) ×1
NS IRRIG 1000ML POUR BTL (IV SOLUTION) ×1 IMPLANT
PACK LAMINECTOMY NEURO (CUSTOM PROCEDURE TRAY) ×1 IMPLANT
PAD ARMBOARD 7.5X6 YLW CONV (MISCELLANEOUS) ×2 IMPLANT
PIN CASPAR 14 (PIN) ×1 IMPLANT
PIN CASPAR 14MM (PIN) ×1
PLATE ANT CERV XTEND 1 LV 12 (Plate) IMPLANT
SCREW VAR SELF DRILL XD 18X4.2 (Screw) IMPLANT
SCREW VAR SELF DRILL XD 4.2 (Screw) ×4 IMPLANT
SPACER HEDRON C 12X14X8 7D (Spacer) IMPLANT
SPONGE KITTNER 5P (MISCELLANEOUS) ×1 IMPLANT
STAPLER SKIN PROX 35W (STAPLE) IMPLANT
SURGIFLO W/THROMBIN 8M KIT (HEMOSTASIS) ×1 IMPLANT
SUT V-LOC 90 ABS DVC 3-0 CL (SUTURE) ×1 IMPLANT
SUT VIC AB 3-0 SH 8-18 (SUTURE) ×1 IMPLANT
SYR 20ML LL LF (SYRINGE) ×1 IMPLANT
TAPE CLOTH 3X10 WHT NS LF (GAUZE/BANDAGES/DRESSINGS) ×3 IMPLANT
TRAP FLUID SMOKE EVACUATOR (MISCELLANEOUS) ×1 IMPLANT
TRAY FOLEY MTR SLVR 16FR STAT (SET/KITS/TRAYS/PACK) IMPLANT
WATER STERILE IRR 1000ML POUR (IV SOLUTION) ×2 IMPLANT

## 2022-06-13 NOTE — Op Note (Addendum)
Indications: Benjamin Andrews is suffering from M54.12 cervical radiculopathy, R29.898 right arm weakness. he failed conservative management, and elected to proceed with surgery.  Findings: foraminal stenosis  Preoperative Diagnosis: M54.12 cervical radiculopathy, R29.898 right arm weakness Postoperative Diagnosis: same   EBL: 50 ml IVF: see AR ml Drains: none Disposition: Extubated and Stable to PACU Complications: none  No foley catheter was placed.   Preoperative Note:   Risks of surgery discussed include: infection, bleeding, stroke, coma, death, paralysis, CSF leak, nerve/spinal cord injury, numbness, tingling, weakness, complex regional pain syndrome, recurrent stenosis and/or disc herniation, vascular injury, development of instability, neck/back pain, need for further surgery, persistent symptoms, development of deformity, and the risks of anesthesia. The patient understood these risks and agreed to proceed.  Operative Note:   Procedure:  1) Anterior cervical diskectomy and fusion at C5-6 2) Anterior cervical instrumentation at C5-6 using Globus Xtend 3) Insertion of biomechanical device at C5-6   Procedure: After obtaining informed consent, the patient taken to the operating room, placed in supine position, general anesthesia induced.  The patient had a small shoulder roll placed behind their shoulders.  The patient received preop antibiotics and IV Decadron.  The patient had a neck incision outlined, was prepped and draped in usual sterile fashion. The incision was injected with local anesthetic.   An incision was opened, dissection taken down medial to the carotid artery and jugular vein, lateral to the trachea and esophagus.  The prevertebral fascia was identified, and a localizing x-ray demonstrated the correct level.  The longus colli were dissected laterally, and self-retaining retractors placed to open the operative field. The microscope was then brought into the  field.  With this complete, distractor pins were placed in the vertebral bodies of C5 and C6. The distractor was placed, and the annulus at C5/6 was opened using a bovie.  Curettes and pituitary rongeurs used to remove the majority of disk, then the drill was used to remove the posterior osteophyte and begin the foraminotomies. The nerve hook was used to elevate the posterior longitudinal ligament, which was then removed with Kerrison rongeurs. Bilateral foraminotomies were performed. The microblunt nerve hook could be passed out the foramen bilaterally.   Meticulous hemostasis was obtained.  A biomechanical device (Globus Hedron C 8 mm height x 14 mm width by 12 mm depth) was placed at C5/6. The device had been filled with allograft for aid in arthrodesis.  The caspar distractor was removed, and bone wax used for hemostasis. A separate, 12 mm Globus Xtend plate was chosen.  Two screws placed in each vertebral body, respectively making sure the screws were behind the locking mechanism.  Final AP and lateral radiographs were taken.   Please note that the plate is not inclusive to the biomechanical device.  The anchoring mechanism of the plate is completely separate from the biomechanical device.  With everything in good position, the wound was irrigated copiously and meticulous hemostasis obtained.  Wound was closed in 2 layers using interrupted inverted 3-0 Vicryl sutures.  The wound was dressed with dermabond, the head of bed at 30 degrees, taken to recovery room in stable condition.  No new postop neurological deficits were identified.  Sponge and pattie counts were correct at the end of the procedure.   I performed the entire procedure with the assistance of Cooper Render PA as an Pensions consultant.An assistant was required for this procedure due to the complexity.  The assistant provided assistance in tissue manipulation and suction, and was  required for the successful and safe performance of the  procedure. I performed the critical portions of the procedure.   Meade Maw MD

## 2022-06-13 NOTE — Anesthesia Procedure Notes (Signed)
Procedure Name: Intubation Date/Time: 06/13/2022 9:18 AM  Performed by: Demetrius Charity, CRNAPre-anesthesia Checklist: Patient identified, Patient being monitored, Timeout performed, Emergency Drugs available and Suction available Patient Re-evaluated:Patient Re-evaluated prior to induction Oxygen Delivery Method: Circle system utilized Preoxygenation: Pre-oxygenation with 100% oxygen Induction Type: IV induction Ventilation: Mask ventilation without difficulty Laryngoscope Size: 3 and McGraph Grade View: Grade I Tube type: Oral Tube size: 7.0 mm Number of attempts: 1 Airway Equipment and Method: Stylet and Video-laryngoscopy Placement Confirmation: ETT inserted through vocal cords under direct vision, positive ETCO2 and breath sounds checked- equal and bilateral Secured at: 23 cm Tube secured with: Tape Dental Injury: Teeth and Oropharynx as per pre-operative assessment

## 2022-06-13 NOTE — Discharge Instructions (Addendum)
Your surgeon has performed an operation on your cervical spine (neck) to relieve pressure on the spinal cord and/or nerves. This involved making an incision in the front of your neck and removing one or more of the discs that support your spine. Next, a small piece of bone, a titanium plate, and screws were used to fuse two or more of the vertebrae (bones) together.  The following are instructions to help in your recovery once you have been discharged from the hospital. Even if you feel well, it is important that you follow these activity guidelines. If you do not let your neck heal properly from the surgery, you can increase the chance of return of your symptoms and other complications.  * Do not take anti-inflammatory medications for 3 months after surgery (naproxen [Aleve], ibuprofen [Advil, Motrin], etc.). These medications can prevent your bones from healing properly.  Activity    No bending, lifting, or twisting ("BLT"). Avoid lifting objects heavier than 10 pounds (gallon milk jug).  Where possible, avoid household activities that involve lifting, bending, reaching, pushing, or pulling such as laundry, vacuuming, grocery shopping, and childcare. Try to arrange for help from friends and family for these activities while your back heals.  Increase physical activity slowly as tolerated.  Taking short walks is encouraged, but avoid strenuous exercise. Do not jog, run, bicycle, lift weights, or participate in any other exercises unless specifically allowed by your doctor.  Talk to your doctor before resuming sexual activity.  You should not drive until cleared by your doctor.  Until released by your doctor, you should not return to work or school.  You should rest at home and let your body heal.   You may shower three days after your surgery.  After showering, lightly dab your incision dry. Do not take a tub bath or go swimming until approved by your doctor at your follow-up appointment.  If  your doctor ordered a cervical collar (neck brace) for you, you should wear it whenever you are out of bed. You may remove it when lying down or sleeping, but you should wear it at all other times. Not all neck surgeries require a cervical collar.  If you smoke, we strongly recommend that you quit.  Smoking has been proven to interfere with normal bone healing and will dramatically reduce the success rate of your surgery. Please contact [facility] (800-QUIT-NOW) and use the resources at www.[facility].com for assistance in stopping smoking.  Surgical Incision   Keep your incision area clean and dry.  Your incision was closed with Dermabond glue. The glue should begin to peel away within about a week.  Diet           You may return to your usual diet. However, you may experience discomfort when swallowing in the first month after your surgery. This is normal. You may find that softer foods are more comfortable for you to swallow. Be sure to stay hydrated.  When to Contact us  You may experience pain in your neck and/or pain between your shoulder blades. This is normal and should improve in the next few weeks with the help of pain medication, muscle relaxers, and rest. Some patients report that a warm compress on the back of the neck or between the shoulder blades helps.  However, should you experience any of the following, contact us immediately: New numbness or weakness Pain that is progressively getting worse, and is not relieved by your pain medication, muscle relaxers, rest, and warm compresses  Bleeding, redness, swelling, pain, or drainage from surgical incision Chills or flu-like symptoms Fever greater than 101.0 F (38.3 C) Inability to eat, drink fluids, or take medications Problems with bowel or bladder functions Difficulty breathing or shortness of breath Warmth, tenderness, or swelling in your calf Contact Information During office hours (Monday-Friday 9 am to 5 pm), please call  your physician at [phone] and ask for Berdine Addison After hours and weekends, please call [phone] and speak with the neurosurgeon on call For a life-threatening emergency, call [place]   The drugs that you were given will stay in your system until tomorrow so for the next 24 hours you should not:  Drive an automobile Make any legal decisions Drink any alcoholic beverage   You may resume regular meals tomorrow.  Today it is better to start with liquids and gradually work up to solid foods.  You may eat anything you prefer, but it is better to start with liquids, then soup and crackers, and gradually work up to solid foods.   Please notify your doctor immediately if you have any unusual bleeding, trouble breathing, redness and pain at the surgery site, drainage, fever, or pain not relieved by medication.    Additional Instructions:        Please contact your physician with any problems or Same Day Surgery at [phone], Monday through Friday 6 am to 4 pm, or [place] at [facility] number at [phone].

## 2022-06-13 NOTE — Anesthesia Preprocedure Evaluation (Addendum)
Anesthesia Evaluation  Patient identified by MRN, date of birth, ID band Patient awake    Reviewed: Allergy & Precautions, NPO status , Patient's Chart, lab work & pertinent test results  Airway Mallampati: I  TM Distance: >3 FB Neck ROM: full    Dental no notable dental hx.    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 last seizure 2016  Neuromuscular disease    GI/Hepatic negative GI ROS, Neg liver ROS,   Endo/Other  negative endocrine ROS  Renal/GU      Musculoskeletal   Abdominal (+) + obese,   Peds  Hematology Thrombocytopenia   Anesthesia Other Findings Past Medical History: No date: Allergic rhinitis No date: Anginal pain (HCC) No date: Blood in stool No date: Cervical radiculopathy at C6 No date: Chickenpox No date: Generalized anxiety disorder No date: Headache No date: Hemorrhoids No date: High blood pressure 1998: Seizures ([place])     Comment:  diagnosed in 2000. last seizure 2016 No date: Sleep apnea No date: UTI (lower urinary tract infection)  Past Surgical History: 12/04/2015: COLONOSCOPY WITH PROPOFOL; N/A     Comment:  Procedure: COLONOSCOPY WITH PROPOFOL;  Surgeon: Manya Silvas, MD;  Location: [facility];  Service:               Endoscopy;  Laterality: N/A; No date: RECONSTRUCTION OF NOSE     Comment:  fracture 2000: SHOULDER SURGERY; Right     Comment:  bicep repair     Reproductive/Obstetrics negative OB ROS                            Anesthesia Physical Anesthesia Plan  ASA:  2  Anesthesia Plan: General ETT   Post-op Pain Management: Toradol IV (intra-op)*, Ofirmev IV (intra-op)* and Regional block*   Induction: Intravenous  PONV Risk Score and Plan: 2 and Ondansetron, Dexamethasone and Midazolam  Airway Management Planned: Oral ETT  Additional Equipment:   Intra-op Plan:   Post-operative Plan: Extubation in OR  Informed Consent: I have reviewed the patients History and Physical, chart, labs and discussed the procedure including the risks, benefits and alternatives for the proposed anesthesia with the patient or authorized representative who has indicated his/her understanding and acceptance.     Dental Advisory Given  Plan Discussed with: Anesthesiologist, CRNA and Surgeon  Anesthesia Plan Comments:        Anesthesia Quick Evaluation

## 2022-06-13 NOTE — Interval H&P Note (Signed)
History and Physical Interval Note:  06/13/2022 8:29 AM  Benjamin Andrews  has presented today for surgery, with the diagnosis of M54.12 cervical radiculopathy R29.898 right arm weakness.  The various methods of treatment have been discussed with the patient and family. After consideration of risks, benefits and other options for treatment, the patient has consented to  Procedure(s): C5-6 ANTERIOR CERVICAL DISCECTOMY AND FUSION (GLOBUS HEDRON) (N/A) as a surgical intervention.  The patient's history has been reviewed, patient examined, no change in status, stable for surgery.  I have reviewed the patient's chart and labs.  Questions were answered to the patient's satisfaction.    Heart sounds normal no MRG. Chest Clear to Auscultation Bilaterally.    Benjamin Andrews

## 2022-06-13 NOTE — Transfer of Care (Signed)
Immediate Anesthesia Transfer of Care Note  Patient: Benjamin Andrews  Procedure(s) Performed: C5-6 ANTERIOR CERVICAL DISCECTOMY AND FUSION (GLOBUS HEDRON) (Neck)  Patient Location: PACU  Anesthesia Type:General  Level of Consciousness: awake, alert  and oriented  Airway & Oxygen Therapy: Patient Spontanous Breathing and Patient connected to face mask oxygen  Post-op Assessment: Report given to RN and Post -op Vital signs reviewed and stable  Post vital signs: Reviewed and stable  Last Vitals:  Vitals Value Taken Time  BP 152/91 06/13/22 1054  Temp    Pulse 91 06/13/22 1056  Resp 13 06/13/22 1056  SpO2 98 % 06/13/22 1056  Vitals shown include unvalidated device data.  Last Pain:  Vitals:   06/13/22 0739  TempSrc: Temporal  PainSc: 0-No pain         Complications: No notable events documented.

## 2022-06-13 NOTE — Discharge Summary (Signed)
Physician Discharge Summary  Patient ID: Benjamin Andrews MRN: [id number] DOB/AGE: 57-Mar-1967 57 y.o.  Admit date: 06/13/2022 Discharge date: 06/13/2022  Admission Diagnoses:  Discharge Diagnoses:  Active Problems:   * No active hospital problems. *   Discharged Condition: good  Hospital Course:  Benjamin Andrews is a 56 y.o s/p C5-6 ACDF for right sided radiculopathy. His intraoperative course was uncomplicated. He was monitored in PACU for a minimum of 4 hours and was discharged home with prescriptions for Percocet, Senna, and Robaxin as needed.   Consults: None  Significant Diagnostic Studies: none  Treatments: surgery: as above. Please see separately dictated operative report for further details  Discharge Exam: Blood pressure (!) 141/93, pulse 81, temperature (!) 97.1 F (36.2 C), temperature source Temporal, resp. rate 18, height '6\' 4"'$  (1.93 m), weight 113.9 kg, SpO2 95 %. CN II-XII grossly intact 5/5 throughout Incision c/d/I with dermabond in place  Disposition: Discharge disposition: 01-Home or Self Care        Allergies as of 06/13/2022   No Known Allergies      Medication List     TAKE these medications    busPIRone 10 MG tablet Commonly known as: BUSPAR Take 1 tablet (10 mg total) by mouth 2 (two) times daily.   COPPER PO Take 1 tablet by mouth daily.   gabapentin 300 MG capsule Commonly known as: NEURONTIN Take 1 capsule (300 mg total) by mouth 3 (three) times daily. What changed: when to take this   JOINT HEALTH PO Take 1 tablet by mouth daily.   MAGNESIUM PO Take 1 tablet by mouth daily.   methocarbamol 500 MG tablet Commonly known as: ROBAXIN Take 1 tablet (500 mg total) by mouth every 6 (six) hours as needed for muscle spasms.   multivitamin tablet Take 1 tablet by mouth daily.   Oxcarbazepine 300 MG tablet Commonly known as: TRILEPTAL Take 1 tablet (300 mg total) by mouth 2 (two) times daily.   oxyCODONE-acetaminophen 5-325 MG  tablet Commonly known as: Percocet Take 1 tablet by mouth every 4 (four) hours as needed for up to 5 days for severe pain.   rosuvastatin 20 MG tablet Commonly known as: CRESTOR Take 1 tablet (20 mg total) by mouth daily.   senna 8.6 MG Tabs tablet Commonly known as: SENOKOT Take 1 tablet (8.6 mg total) by mouth daily as needed for mild constipation.   VITAMIN A PO Take 1 tablet by mouth daily.   VITAMIN C PO Take 1 tablet by mouth daily.   VITAMIN D PO Take 1 tablet by mouth daily.   ZINC PO Take 1 tablet by mouth daily.        Follow-up Information     Geronimo Boot, PA-C Follow up in 2 week(s).   Specialty: Neurosurgery Why: for post-op and incision check  AS PREVIOUSLY SCHEDULED CORRECT PHONE NBR FOR DR. [facility] OFFICE [phone] Contact information: [place] [address] [phone]                 Signed: Loleta Dicker 06/13/2022, 4:33 PM

## 2022-06-14 ENCOUNTER — Encounter: Payer: Self-pay | Admitting: Neurosurgery

## 2022-06-14 NOTE — Anesthesia Postprocedure Evaluation (Signed)
Anesthesia Post Note  Patient: Benjamin Andrews  Procedure(s) Performed: C5-6 ANTERIOR CERVICAL DISCECTOMY AND FUSION (GLOBUS HEDRON) (Neck)  Patient location during evaluation: PACU Anesthesia Type: General Level of consciousness: awake and alert Pain management: pain level controlled Vital Signs Assessment: post-procedure vital signs reviewed and stable Respiratory status: spontaneous breathing, nonlabored ventilation and respiratory function stable Cardiovascular status: blood pressure returned to baseline and stable Postop Assessment: no apparent nausea or vomiting Anesthetic complications: no   No notable events documented.   Last Vitals:  Vitals:   06/13/22 1327 06/13/22 1449  BP: (!) 156/99 (!) 141/93  Pulse: 74 81  Resp: 18 18  Temp: (!) 36.2 C   SpO2: 97% 95%    Last Pain:  Vitals:   06/13/22 1449  TempSrc:   PainSc: [place]

## 2022-06-25 ENCOUNTER — Encounter: Payer: Self-pay | Admitting: Family Medicine

## 2022-06-27 NOTE — Progress Notes (Unsigned)
   REFERRING PHYSICIAN:  Meade Maw, [address] [place],  [place] [address]  DOS: 06/13/22 ACDF C5-C6  HISTORY OF PRESENT ILLNESS: Benjamin Andrews is 2 weeks status post ACDF C5-C6. He was discharged home on percocet and robaxin.   He is doing well.   Not taking gabapentin (caused increase in BP). Not taking percocet or robaxin.   He is doing well with minimal neck pain and no arm pain. Right arm weakness is improving.   He works as a Furniture conservator/restorer. He remains out of work.    PHYSICAL EXAMINATION:  NEUROLOGICAL:  General: In no acute distress.   Awake, alert, oriented to person, place, and time.  Pupils equal round and reactive to light.  Facial tone is symmetric.  Tongue protrusion is midline.  There is no pronator drift.  Strength: Side Biceps Triceps Deltoid Interossei Grip Wrist Ext. Wrist Flex.  R 5- '5 5 5 5 5 5  '$ L '5 5 5 5 5 5 5   '$ Incision c/d/I  Imaging:  Nothing new to review  Assessment / Plan: Benjamin Andrews is doing well s/p above surgery. His preop right arm weakness is improving and he has minimal pain. Treatment options reviewed with patient and following plan made:   - We discussed activity escalation and I have advised the patient to lift up to 10 pounds until 6 weeks after surgery (until follow up with Dr. Izora Ribas).  - Reviewed wound care.  - Follow up in 4 weeks with Dr. Izora Ribas as scheduled.   Advised to contact the office if any questions or concerns arise.   Geronimo Boot PA-C Dept of Neurosurgery

## 2022-06-28 ENCOUNTER — Ambulatory Visit (INDEPENDENT_AMBULATORY_CARE_PROVIDER_SITE_OTHER): Payer: BC Managed Care – PPO | Admitting: Orthopedic Surgery

## 2022-06-28 ENCOUNTER — Encounter: Payer: Self-pay | Admitting: Orthopedic Surgery

## 2022-06-28 VITALS — BP 142/86 | Temp 98.0°F | Ht 76.0 in | Wt 248.0 lb

## 2022-06-28 DIAGNOSIS — Z981 Arthrodesis status: Secondary | ICD-10-CM

## 2022-06-28 DIAGNOSIS — Z09 Encounter for follow-up examination after completed treatment for conditions other than malignant neoplasm: Secondary | ICD-10-CM

## 2022-06-28 DIAGNOSIS — R29898 Other symptoms and signs involving the musculoskeletal system: Secondary | ICD-10-CM

## 2022-07-05 DIAGNOSIS — Z86010 Personal history of colonic polyps: Secondary | ICD-10-CM | POA: Diagnosis not present

## 2022-07-06 DIAGNOSIS — E291 Testicular hypofunction: Secondary | ICD-10-CM | POA: Diagnosis not present

## 2022-07-06 DIAGNOSIS — Z79899 Other long term (current) drug therapy: Secondary | ICD-10-CM | POA: Diagnosis not present

## 2022-07-06 DIAGNOSIS — N411 Chronic prostatitis: Secondary | ICD-10-CM | POA: Diagnosis not present

## 2022-07-06 DIAGNOSIS — N5201 Erectile dysfunction due to arterial insufficiency: Secondary | ICD-10-CM | POA: Diagnosis not present

## 2022-07-12 ENCOUNTER — Other Ambulatory Visit: Payer: Self-pay | Admitting: Urology

## 2022-07-12 DIAGNOSIS — R972 Elevated prostate specific antigen [PSA]: Secondary | ICD-10-CM

## 2022-07-18 ENCOUNTER — Ambulatory Visit
Admission: RE | Admit: 2022-07-18 | Discharge: 2022-07-18 | Disposition: A | Discharge status: Home / Self Care | Payer: BC Managed Care – PPO | Source: Ambulatory Visit | Attending: Urology | Admitting: Urology

## 2022-07-18 DIAGNOSIS — N402 Nodular prostate without lower urinary tract symptoms: Secondary | ICD-10-CM | POA: Diagnosis not present

## 2022-07-18 DIAGNOSIS — R972 Elevated prostate specific antigen [PSA]: Secondary | ICD-10-CM | POA: Insufficient documentation

## 2022-07-18 DIAGNOSIS — R590 Localized enlarged lymph nodes: Secondary | ICD-10-CM | POA: Diagnosis not present

## 2022-07-18 DIAGNOSIS — N400 Enlarged prostate without lower urinary tract symptoms: Secondary | ICD-10-CM | POA: Diagnosis not present

## 2022-07-18 MED ORDER — GADOPICLENOL 0.5 MMOL/ML IV SOLN
10.0000 mL | Freq: Once | INTRAVENOUS | Status: AC | PRN
  Administered 2022-07-18: 10 mL via INTRAVENOUS

## 2022-07-25 ENCOUNTER — Other Ambulatory Visit: Payer: Self-pay

## 2022-07-25 DIAGNOSIS — Z981 Arthrodesis status: Secondary | ICD-10-CM

## 2022-07-26 ENCOUNTER — Ambulatory Visit
Admission: RE | Admit: 2022-07-26 | Discharge: 2022-07-26 | Disposition: A | Discharge status: Home / Self Care | Payer: BC Managed Care – PPO | Source: Ambulatory Visit | Attending: Neurosurgery | Admitting: Neurosurgery

## 2022-07-26 ENCOUNTER — Ambulatory Visit (INDEPENDENT_AMBULATORY_CARE_PROVIDER_SITE_OTHER): Payer: BC Managed Care – PPO | Admitting: Neurosurgery

## 2022-07-26 ENCOUNTER — Encounter: Payer: Self-pay | Admitting: Neurosurgery

## 2022-07-26 VITALS — BP 130/78 | Ht 76.0 in | Wt 248.0 lb

## 2022-07-26 DIAGNOSIS — Z981 Arthrodesis status: Secondary | ICD-10-CM | POA: Diagnosis not present

## 2022-07-26 DIAGNOSIS — M4322 Fusion of spine, cervical region: Secondary | ICD-10-CM | POA: Diagnosis not present

## 2022-07-26 DIAGNOSIS — M5412 Radiculopathy, cervical region: Secondary | ICD-10-CM

## 2022-07-26 DIAGNOSIS — Z09 Encounter for follow-up examination after completed treatment for conditions other than malignant neoplasm: Secondary | ICD-10-CM

## 2022-07-26 NOTE — Progress Notes (Signed)
   REFERRING PHYSICIAN:  Meade Maw, [address] [place],  [place] [address]  DOS: 06/13/22 ACDF C5-C6  HISTORY OF PRESENT ILLNESS: Jakin Pavao is status post ACDF C5-C6.  He is doing very well.  His numbness is better.  He has minimal neck pain.     PHYSICAL EXAMINATION:  NEUROLOGICAL:  General: In no acute distress.   Awake, alert, oriented to person, place, and time.  Pupils equal round and reactive to light.  Facial tone is symmetric.  Tongue protrusion is midline.  There is no pronator drift.  Strength: Side Biceps Triceps Deltoid Interossei Grip Wrist Ext. Wrist Flex.  R '5 5 5 5 5 5 5  '$ L '5 5 5 5 5 5 5   '$ Incision c/d/I  Imaging:  Nothing new to review  Assessment / Plan: Gaige Sebo is doing well s/p above surgery.  His weakness is much improved.  I am very pleased with his improvements.  I gave him some exercises to do for his neck.  I will release him to return to work with restrictions on October 23.  I will see him back in 6 weeks with x-rays.     Meade Maw MD Dept of Neurosurgery

## 2022-07-27 DIAGNOSIS — N401 Enlarged prostate with lower urinary tract symptoms: Secondary | ICD-10-CM | POA: Diagnosis not present

## 2022-07-27 DIAGNOSIS — R972 Elevated prostate specific antigen [PSA]: Secondary | ICD-10-CM | POA: Diagnosis not present

## 2022-07-27 DIAGNOSIS — N5201 Erectile dysfunction due to arterial insufficiency: Secondary | ICD-10-CM | POA: Diagnosis not present

## 2022-07-27 DIAGNOSIS — N411 Chronic prostatitis: Secondary | ICD-10-CM | POA: Diagnosis not present

## 2022-08-04 DIAGNOSIS — N411 Chronic prostatitis: Secondary | ICD-10-CM | POA: Diagnosis not present

## 2022-08-04 DIAGNOSIS — N401 Enlarged prostate with lower urinary tract symptoms: Secondary | ICD-10-CM | POA: Diagnosis not present

## 2022-08-12 DIAGNOSIS — N401 Enlarged prostate with lower urinary tract symptoms: Secondary | ICD-10-CM | POA: Diagnosis not present

## 2022-08-16 ENCOUNTER — Telehealth: Payer: Self-pay

## 2022-08-16 ENCOUNTER — Encounter: Payer: Self-pay | Admitting: Neurosurgery

## 2022-08-16 NOTE — Telephone Encounter (Signed)
Patient has been notified

## 2022-08-16 NOTE — Telephone Encounter (Signed)
-----   Message from Peggyann Shoals sent at 08/16/2022 10:32 AM EDT ----- Regarding: insurance letter Contact: [phone]  C5-6 ACDF on 06/13/22 Patient is stating that he received an EOB from [facility] that his surgery was not paid for because it was filed under [employer]. He also received a statement from [facility] that he owed $33,000.00. He spoke to [facility] and was told that he needs a letter from the surgeon that his is not w/c and it needs to be filed under [employer]. Attach claim# [id number] TH to the letter and fax to [phone].  Do you need him to bring the EOB?

## 2022-08-16 NOTE — Telephone Encounter (Signed)
Letter faxed.

## 2022-08-24 ENCOUNTER — Encounter: Payer: Self-pay | Admitting: Family Medicine

## 2022-08-29 ENCOUNTER — Other Ambulatory Visit: Payer: Self-pay

## 2022-08-29 DIAGNOSIS — Z981 Arthrodesis status: Secondary | ICD-10-CM

## 2022-09-01 ENCOUNTER — Ambulatory Visit
Admission: RE | Admit: 2022-09-01 | Discharge: 2022-09-01 | Disposition: A | Discharge status: Home / Self Care | Payer: BC Managed Care – PPO | Attending: Neurosurgery | Admitting: Neurosurgery

## 2022-09-01 ENCOUNTER — Ambulatory Visit
Admission: RE | Admit: 2022-09-01 | Discharge: 2022-09-01 | Disposition: A | Discharge status: Home / Self Care | Payer: BC Managed Care – PPO | Source: Ambulatory Visit | Attending: Neurosurgery | Admitting: Neurosurgery

## 2022-09-01 ENCOUNTER — Ambulatory Visit (INDEPENDENT_AMBULATORY_CARE_PROVIDER_SITE_OTHER): Payer: BC Managed Care – PPO | Admitting: Neurosurgery

## 2022-09-01 VITALS — BP 151/91 | HR 60 | Temp 97.6°F | Ht 76.0 in | Wt 248.2 lb

## 2022-09-01 DIAGNOSIS — Z981 Arthrodesis status: Secondary | ICD-10-CM | POA: Insufficient documentation

## 2022-09-01 DIAGNOSIS — M5412 Radiculopathy, cervical region: Secondary | ICD-10-CM

## 2022-09-01 DIAGNOSIS — Z09 Encounter for follow-up examination after completed treatment for conditions other than malignant neoplasm: Secondary | ICD-10-CM

## 2022-09-01 NOTE — Progress Notes (Signed)
   REFERRING PHYSICIAN:  Alease Medina [address] [place],  [place] [address]  DOS: 06/13/22 ACDF C5-C6  HISTORY OF PRESENT ILLNESS: Benjamin Andrews is status post ACDF C5-C6.  He is doing very well.  His numbness is better.  He has minimal neck pain.      PHYSICAL EXAMINATION:  NEUROLOGICAL:  General: In no acute distress.   Awake, alert, oriented to person, place, and time.  Pupils equal round and reactive to light.  Facial tone is symmetric.  Tongue protrusion is midline.  There is no pronator drift.  Strength: Side Biceps Triceps Deltoid Interossei Grip Wrist Ext. Wrist Flex.  R '5 5 5 5 5 5 5  '$ L '5 5 5 5 5 5 5   '$ Incision c/d/I  Imaging:  Nothing new to review  Assessment / Plan: Benjamin Andrews is doing well s/p above surgery.  His weakness is much improved.  I am very pleased with his improvements.  I will see him back in 6 months with x-rays. he is released to return to work on November 27.     Meade Maw MD Dept of Neurosurgery

## 2022-09-24 ENCOUNTER — Other Ambulatory Visit: Payer: Self-pay | Admitting: Family Medicine

## 2022-09-27 ENCOUNTER — Encounter: Payer: Self-pay | Admitting: Neurology

## 2022-09-27 ENCOUNTER — Ambulatory Visit (INDEPENDENT_AMBULATORY_CARE_PROVIDER_SITE_OTHER): Payer: BC Managed Care – PPO | Admitting: Neurology

## 2022-09-27 DIAGNOSIS — R569 Unspecified convulsions: Secondary | ICD-10-CM | POA: Diagnosis not present

## 2022-09-27 MED ORDER — OXCARBAZEPINE 300 MG PO TABS: 300.0000 mg | ORAL_TABLET | Freq: Two times a day (BID) | ORAL | 4 refills | Status: DC

## 2022-09-27 NOTE — Progress Notes (Signed)
PATIENT: Benjamin Andrews DOB: 22-Jan-1966  REASON FOR VISIT: follow up HISTORY FROM: patient Primary Neurologist: Dr. Krista Blue   HISTORY  Benjamin Andrews is a 56 year old male, seen in request by his primary care physician Dr. [name] for evaluation of seizure   I have reviewed and summarized the referring note from the referring physician.  He had a history of anxiety, taking Buspar '10mg'$  bid, anxiety, also history of nocturnal seizure, taking Trileptal '300mg'$  bid.used to have sleep apnea, he lost 20 pounds recently, is no longer using CPAP machine   I was able to review his previous neurologist's note, Dr. Alfonso Patten from [facility], most recent office note was on July 11, 2018, he has retired.  Patient reported a history of nocturnal seizure since 1998, there was a delayed diagnosis, he presented with right shoulder dislocation 3 times after woke up from overnight sleep, eventually had right shoulder surgery, shortly following his right shoulder surgery, he had a witnessed nocturnal generalized tonic-clonic seizure in 2000   Per patient, EEG, and MRI showed no significant abnormality, he was treated with a different antiepileptic medication, could not tolerated, then he was treated with Trileptal 150 mg 3 times a day, complains of daytime sleepiness, he has been on stable dose Trileptal '150mg'$  2 times a day for a long time, in 2015, in attempt to wean off Trileptal, he suffered another recurrent nocturnal seizure, woke up with whole-body muscle achy pain   He works as a Furniture conservator/restorer, does not want to have MRI of the brain, or EEG at this point   His maternal aunt, and maternal great grandmother suffered epilepsy,  Update October 01, 2020 SS: Here today for routine follow-up, continues to do well, last seizure was 3 years ago when he forgot to take his medication, has nocturnal seizures only. Knows had seizure because wakes up really sore.  Remains on Trileptal 300 mg twice a day.   Tolerates well.  Previously had MRI of the brain and EEG with prior neurologist.  Continues to do well.  Works as a Furniture conservator/restorer.  Update September 30, 2021 SS: Continues to do well, no recurrent seizure, remains on Trileptal 300 mg twice a day.  Tolerates well.  Continues to work full-time as a Furniture conservator/restorer, he has the rest of the year off.  Leaving today to travel to [facility] to see his ill mother.   Update September 27, 2022 SS: Injured his neck after heavy lifting, In August had C5-6 discectomy and fusion at [facility] with Dr. Izora Ribas. He did very well. He just went back to work few weeks ago. Denies any seizures. Remains on Trileptal. Labs 09/30/22 platelets 132, normal CMP, Trileptal level 6. Has been 5 years since last seizure.   REVIEW OF SYSTEMS: Out of a complete 14 system review of symptoms, the patient complains only of the following symptoms, and all other reviewed systems are negative.  N/A  ALLERGIES: No Known Allergies  HOME MEDICATIONS: Outpatient Medications Prior to Visit  Medication Sig Dispense Refill   alfuzosin (UROXATRAL) 10 MG 24 hr tablet Take 10 mg by mouth daily with breakfast.     Ascorbic Acid (VITAMIN C) 100 MG tablet Take 100 mg by mouth daily.     BIOTIN PO Take 1 tablet by mouth daily.     busPIRone (BUSPAR) 10 MG tablet Take 1 tablet (10 mg total) by mouth 2 (two) times daily. 180 tablet 1   COPPER PO Take 1 tablet by mouth daily.  dutasteride (AVODART) 0.5 MG capsule Take 0.5 mg by mouth daily.     MAGNESIUM PO Take 3 tablets by mouth daily.     Misc Natural Products (JOINT HEALTH PO) Take 1 tablet by mouth daily.     Multiple Vitamin (MULTIVITAMIN) tablet Take 1 tablet by mouth daily.     Multiple Vitamins-Minerals (ZINC PO) Take 1 tablet by mouth daily.     OMEGA-3 FATTY ACIDS PO Take 1 tablet by mouth daily.     rosuvastatin (CRESTOR) 20 MG tablet Take 1 tablet (20 mg total) by mouth daily. 90 tablet 3   Sodium Hyaluronate, oral, (HYALURONIC ACID  PO) Take 1 tablet by mouth daily.     VITAMIN D PO Take 1 tablet by mouth daily.     Oxcarbazepine (TRILEPTAL) 300 MG tablet Take 1 tablet (300 mg total) by mouth 2 (two) times daily. 180 tablet 4   No facility-administered medications prior to visit.    PAST MEDICAL HISTORY: Past Medical History:  Diagnosis Date   Allergic rhinitis    Anginal pain ([place])    Blood in stool    Cervical radiculopathy at C6    Chickenpox    Generalized anxiety disorder    Headache    Hemorrhoids    High blood pressure    Seizures ([place]) 1998   diagnosed in 2000. last seizure 2016   Sleep apnea    UTI (lower urinary tract infection)     PAST SURGICAL HISTORY: Past Surgical History:  Procedure Laterality Date   ANTERIOR CERVICAL DECOMP/DISCECTOMY FUSION N/A 06/13/2022   Procedure: C5-6 ANTERIOR CERVICAL DISCECTOMY AND FUSION (GLOBUS HEDRON);  Surgeon: Meade Maw, MD;  Location: [facility] ORS;  Service: Neurosurgery;  Laterality: N/A;   COLONOSCOPY WITH PROPOFOL N/A 12/04/2015   Procedure: COLONOSCOPY WITH PROPOFOL;  Surgeon: Manya Silvas, MD;  Location: [facility] ENDOSCOPY;  Service: Endoscopy;  Laterality: N/A;   RECONSTRUCTION OF NOSE     fracture   SHOULDER SURGERY Right 2000   bicep repair    FAMILY HISTORY: Family History  Problem Relation Age of Onset   Alcoholism Other        Grandparent   Arthritis Other        Grandparent   Stroke Other        Parent   Diabetes Other        Grandparent   Rectal cancer Maternal Uncle    Cancer Mother        unsure - started in back   Healthy Father     SOCIAL HISTORY: Social History   Socioeconomic History   Marital status: Married    Spouse name: Kermit Balo   Number of children: 1   Years of education: some college   Highest education level: Not on file  Occupational History   Occupation: Furniture conservator/restorer  Tobacco Use   Smoking status: Never   Smokeless tobacco: Former    Types: Nurse, children's Use: Never used   Substance and Sexual Activity   Alcohol use: Yes    Comment: occassional (once a month)   Drug use: No   Sexual activity: Yes  Other Topics Concern   Not on file  Social History Narrative   Lives at home with his significant other.   Two cups caffeine per day.   Social Determinants of Health   Financial Resource Strain: Not on file  Food Insecurity: Not on file  Transportation Needs: Not on file  Physical Activity: Not on  file  Stress: Not on file  Social Connections: Not on file  Intimate Partner Violence: Not on file    PHYSICAL EXAM  Vitals:   09/27/22 0745  BP: 137/76  Pulse: (!) 53  Weight: 246 lb 8 oz (111.8 kg)  Height: '6\' 3"'$  (1.905 m)   Body mass index is 30.81 kg/m.  Generalized: Well developed, in no acute distress   Neurological examination  Mentation: Alert oriented to time, place, history taking. Follows all commands speech and language fluent, very pleasant Cranial nerve II-XII: Pupils were equal round reactive to light. Extraocular movements were full, visual field were full on confrontational test. Facial sensation and strength were normal.  Head turning and shoulder shrug  were normal and symmetric. Motor: The motor testing reveals 5 over 5 strength of all 4 extremities. Good symmetric motor tone is noted throughout.  Sensory: Sensory testing is intact to soft touch on all 4 extremities. No evidence of extinction is noted.  Coordination: Cerebellar testing reveals good finger-nose-finger and heel-to-shin bilaterally.  Gait and station: Gait is normal.  Reflexes: Deep tendon reflexes are symmetric and normal bilaterally.   DIAGNOSTIC DATA (LABS, IMAGING, TESTING) - I reviewed patient records, labs, notes, testing and imaging myself where available.  Lab Results  Component Value Date   WBC 6.4 06/06/2022   HGB 15.4 06/06/2022   HCT 45.8 06/06/2022   MCV 89.6 06/06/2022   PLT 145 (L) 06/06/2022      Component Value Date/Time   NA 141  06/06/2022 0930   NA 142 09/30/2021 0807   K 3.9 06/06/2022 0930   CL 106 06/06/2022 0930   CO2 29 06/06/2022 0930   GLUCOSE 89 06/06/2022 0930   BUN 14 06/06/2022 0930   BUN 15 09/30/2021 0807   CREATININE 0.87 06/06/2022 0930   CREATININE 1.05 01/08/2021 1442   CALCIUM 9.3 06/06/2022 0930   PROT 6.9 09/30/2021 0807   ALBUMIN 4.8 09/30/2021 0807   AST 34 09/30/2021 0807   ALT 38 09/30/2021 0807   ALKPHOS 83 09/30/2021 0807   BILITOT 0.3 09/30/2021 0807   GFRNONAA >60 06/06/2022 0930   GFRAA 108 10/01/2020 0807   Lab Results  Component Value Date   CHOL 117 01/14/2022   HDL 35 (L) 01/14/2022   LDLCALC 60 01/14/2022   TRIG 140 01/14/2022   CHOLHDL 3.3 01/14/2022   Lab Results  Component Value Date   HGBA1C 5.4 01/14/2022   No results found for: "VITAMINB12" Lab Results  Component Value Date   TSH 1.34 10/07/2015    ASSESSMENT AND PLAN 56 y.o. year old male  has a past medical history of Allergic rhinitis, Anginal pain ([place]), Blood in stool, Cervical radiculopathy at C6, Chickenpox, Generalized anxiety disorder, Headache, Hemorrhoids, High blood pressure, Seizures ([place]) (1998), Sleep apnea, and UTI (lower urinary tract infection). here with:  1.  Epilepsy -No recent seizures, continue Trileptal 300 mg twice a day -Check routine labs today -Follow-up in 1 year or sooner if needed  Evangeline Dakin, DNP 09/27/2022, 8:19 AM [facility] [address], [place], [place] [address] [phone]

## 2022-09-30 ENCOUNTER — Ambulatory Visit
Admission: RE | Admit: 2022-09-30 | Discharge: 2022-09-30 | Disposition: A | Discharge status: Home / Self Care | Payer: BC Managed Care – PPO | Attending: Gastroenterology | Admitting: Gastroenterology

## 2022-09-30 ENCOUNTER — Ambulatory Visit: Payer: BC Managed Care – PPO | Admitting: Anesthesiology

## 2022-09-30 ENCOUNTER — Encounter
Admission: RE | Disposition: A | Discharge status: Home / Self Care | Payer: Self-pay | Source: Home / Self Care | Attending: Gastroenterology

## 2022-09-30 ENCOUNTER — Other Ambulatory Visit: Payer: Self-pay

## 2022-09-30 ENCOUNTER — Encounter: Payer: Self-pay | Admitting: *Deleted

## 2022-09-30 DIAGNOSIS — I10 Essential (primary) hypertension: Secondary | ICD-10-CM | POA: Insufficient documentation

## 2022-09-30 DIAGNOSIS — Z800 Family history of malignant neoplasm of digestive organs: Secondary | ICD-10-CM | POA: Diagnosis not present

## 2022-09-30 DIAGNOSIS — Z86010 Personal history of colonic polyps: Secondary | ICD-10-CM | POA: Insufficient documentation

## 2022-09-30 DIAGNOSIS — Z09 Encounter for follow-up examination after completed treatment for conditions other than malignant neoplasm: Secondary | ICD-10-CM | POA: Insufficient documentation

## 2022-09-30 DIAGNOSIS — G709 Myoneural disorder, unspecified: Secondary | ICD-10-CM | POA: Diagnosis not present

## 2022-09-30 DIAGNOSIS — K5900 Constipation, unspecified: Secondary | ICD-10-CM | POA: Insufficient documentation

## 2022-09-30 DIAGNOSIS — K649 Unspecified hemorrhoids: Secondary | ICD-10-CM | POA: Diagnosis not present

## 2022-09-30 DIAGNOSIS — G4730 Sleep apnea, unspecified: Secondary | ICD-10-CM | POA: Diagnosis not present

## 2022-09-30 DIAGNOSIS — K640 First degree hemorrhoids: Secondary | ICD-10-CM | POA: Diagnosis not present

## 2022-09-30 DIAGNOSIS — Z79899 Other long term (current) drug therapy: Secondary | ICD-10-CM | POA: Insufficient documentation

## 2022-09-30 DIAGNOSIS — F411 Generalized anxiety disorder: Secondary | ICD-10-CM | POA: Insufficient documentation

## 2022-09-30 HISTORY — PX: COLONOSCOPY WITH PROPOFOL: SHX5780

## 2022-09-30 LAB — CBC WITH DIFFERENTIAL/PLATELET
Basophils Absolute: 0 10*3/uL (ref 0.0–0.2)
Basos: 1 %
EOS (ABSOLUTE): 0.4 10*3/uL (ref 0.0–0.4)
Eos: 6 %
Hematocrit: 48.9 % (ref 37.5–51.0)
Hemoglobin: 16.3 g/dL (ref 13.0–17.7)
Immature Grans (Abs): 0 10*3/uL (ref 0.0–0.1)
Immature Granulocytes: 0 %
Lymphocytes Absolute: 1.9 10*3/uL (ref 0.7–3.1)
Lymphs: 33 %
MCH: 30.1 pg (ref 26.6–33.0)
MCHC: 33.3 g/dL (ref 31.5–35.7)
MCV: 90 fL (ref 79–97)
Monocytes Absolute: 0.5 10*3/uL (ref 0.1–0.9)
Monocytes: 8 %
Neutrophils Absolute: 3 10*3/uL (ref 1.4–7.0)
Neutrophils: 52 %
Platelets: 154 10*3/uL (ref 150–450)
RBC: 5.42 x10E6/uL (ref 4.14–5.80)
RDW: 12.4 % (ref 11.6–15.4)
WBC: 5.8 10*3/uL (ref 3.4–10.8)

## 2022-09-30 LAB — COMPREHENSIVE METABOLIC PANEL
ALT: 57 IU/L — ABNORMAL HIGH (ref 0–44)
AST: 46 IU/L — ABNORMAL HIGH (ref 0–40)
Albumin/Globulin Ratio: 1.7 (ref 1.2–2.2)
Albumin: 4.3 g/dL (ref 3.8–4.9)
Alkaline Phosphatase: 94 IU/L (ref 44–121)
BUN/Creatinine Ratio: 15 (ref 9–20)
BUN: 13 mg/dL (ref 6–24)
Bilirubin Total: 0.5 mg/dL (ref 0.0–1.2)
CO2: 23 mmol/L (ref 20–29)
Calcium: 9.8 mg/dL (ref 8.7–10.2)
Chloride: 103 mmol/L (ref 96–106)
Creatinine, Ser: 0.85 mg/dL (ref 0.76–1.27)
Globulin, Total: 2.6 g/dL (ref 1.5–4.5)
Glucose: 99 mg/dL (ref 70–99)
Potassium: 4.9 mmol/L (ref 3.5–5.2)
Sodium: 141 mmol/L (ref 134–144)
Total Protein: 6.9 g/dL (ref 6.0–8.5)
eGFR: 101 mL/min/{1.73_m2} (ref >59)

## 2022-09-30 LAB — 10-HYDROXYCARBAZEPINE: Oxcarbazepine SerPl-Mcnc: 5 ug/mL — ABNORMAL LOW (ref 10–35)

## 2022-09-30 SURGERY — COLONOSCOPY WITH PROPOFOL
Anesthesia: General

## 2022-09-30 MED ORDER — LIDOCAINE HCL (CARDIAC) PF 100 MG/5ML IV SOSY
PREFILLED_SYRINGE | INTRAVENOUS | Status: DC | PRN
  Administered 2022-09-30: 50 mg via INTRAVENOUS

## 2022-09-30 MED ORDER — PROPOFOL 10 MG/ML IV BOLUS
INTRAVENOUS | Status: DC | PRN
  Administered 2022-09-30: 80 mg via INTRAVENOUS

## 2022-09-30 MED ORDER — SODIUM CHLORIDE 0.9 % IV SOLN: INTRAVENOUS | Status: DC

## 2022-09-30 MED ORDER — PROPOFOL 500 MG/50ML IV EMUL
INTRAVENOUS | Status: DC | PRN
  Administered 2022-09-30: 150 ug/kg/min via INTRAVENOUS

## 2022-09-30 NOTE — Anesthesia Postprocedure Evaluation (Signed)
Anesthesia Post Note  Patient: Benjamin Andrews  Procedure(s) Performed: COLONOSCOPY WITH PROPOFOL  Patient location during evaluation: Endoscopy Anesthesia Type: General Level of consciousness: awake and alert Pain management: pain level controlled Vital Signs Assessment: post-procedure vital signs reviewed and stable Respiratory status: spontaneous breathing, nonlabored ventilation, respiratory function stable and patient connected to nasal cannula oxygen Cardiovascular status: blood pressure returned to baseline and stable Postop Assessment: no apparent nausea or vomiting Anesthetic complications: no   No notable events documented.   Last Vitals:  Vitals:   09/30/22 1110 09/30/22 1118  BP:  135/83  Pulse: 66 63  Resp: 14 19  Temp:    SpO2: 100% 99%    Last Pain:  Vitals:   09/30/22 1001  TempSrc: Temporal  PainSc: 0-No pain                 Precious Haws [name]

## 2022-09-30 NOTE — Interval H&P Note (Signed)
History and Physical Interval Note:  09/30/2022 10:31 AM  Benjamin Andrews  has presented today for surgery, with the diagnosis of personal history colon polyps.  The various methods of treatment have been discussed with the patient and family. After consideration of risks, benefits and other options for treatment, the patient has consented to  Procedure(s): COLONOSCOPY WITH PROPOFOL (N/A) as a surgical intervention.  The patient's history has been reviewed, patient examined, no change in status, stable for surgery.  I have reviewed the patient's chart and labs.  Questions were answered to the patient's satisfaction.     Lesly Rubenstein  Ok to proceed with colonoscopy

## 2022-09-30 NOTE — Anesthesia Procedure Notes (Signed)
Procedure Name: MAC Date/Time: 09/30/2022 10:31 AM  Performed by: Biagio Borg, CRNAPre-anesthesia Checklist: Patient identified, Emergency Drugs available, Suction available, Patient being monitored and Timeout performed Patient Re-evaluated:Patient Re-evaluated prior to induction Oxygen Delivery Method: Nasal cannula Induction Type: IV induction Placement Confirmation: positive ETCO2 and CO2 detector

## 2022-09-30 NOTE — H&P (Signed)
Outpatient short stay form Pre-procedure 09/30/2022  Benjamin Rubenstein, MD  Primary Physician: Leone Haven, MD  Reason for visit:  Surveillance colonoscopy  History of present illness:    56 y/o gentleman with history of seizures and arthritis here for surveillance colonoscopy. Last colonoscopy in 2022 with poor prep. Colonoscopy in 2017 with SSA. Patient endorses issues with constipation. Uncle with rectal cancer. No significant abdominal surgeries. No blood thinners.    Current Facility-Administered Medications:    0.9 %  sodium chloride infusion, , Intravenous, Continuous, Ahmon Tosi, Hilton Cork, MD, Last Rate: 20 mL/hr at 09/30/22 1015, New Bag at 09/30/22 1015  Medications Prior to Admission  Medication Sig Dispense Refill Last Dose   alfuzosin (UROXATRAL) 10 MG 24 hr tablet Take 10 mg by mouth daily with breakfast.   09/30/2022 at 0400   busPIRone (BUSPAR) 10 MG tablet Take 1 tablet by mouth twice daily 180 tablet 3 09/30/2022 at 0400   dutasteride (AVODART) 0.5 MG capsule Take 0.5 mg by mouth daily.   09/29/2022   Oxcarbazepine (TRILEPTAL) 300 MG tablet Take 1 tablet (300 mg total) by mouth 2 (two) times daily. 180 tablet 4 09/30/2022 at 0400   rosuvastatin (CRESTOR) 20 MG tablet Take 1 tablet (20 mg total) by mouth daily. 90 tablet 3 09/30/2022 at 0400   Ascorbic Acid (VITAMIN C) 100 MG tablet Take 100 mg by mouth daily.   09/28/2022   BIOTIN PO Take 1 tablet by mouth daily.   09/28/2022   COPPER PO Take 1 tablet by mouth daily.   09/28/2022   MAGNESIUM PO Take 3 tablets by mouth daily.   09/28/2022   Misc Natural Products (JOINT HEALTH PO) Take 1 tablet by mouth daily.   09/28/2022   Multiple Vitamin (MULTIVITAMIN) tablet Take 1 tablet by mouth daily.   09/28/2022   Multiple Vitamins-Minerals (ZINC PO) Take 1 tablet by mouth daily.   09/28/2022   OMEGA-3 FATTY ACIDS PO Take 1 tablet by mouth daily.   09/28/2022   Sodium Hyaluronate, oral, (HYALURONIC ACID PO) Take 1  tablet by mouth daily.   09/28/2022   VITAMIN D PO Take 1 tablet by mouth daily.   09/28/2022     No Known Allergies   Past Medical History:  Diagnosis Date   Allergic rhinitis    Anginal pain ([place])    Blood in stool    Cervical radiculopathy at C6    Chickenpox    Generalized anxiety disorder    Headache    Hemorrhoids    High blood pressure    Seizures ([place]) 1998   diagnosed in 2000. last seizure 2016   Sleep apnea    UTI (lower urinary tract infection)     Review of systems:  Otherwise negative.    Physical Exam  Gen: Alert, oriented. Appears stated age.  HEENT: PERRLA. Lungs: CTA CV: RRR Abd: soft, benign, no masses Ext: No edema    Planned procedures: Proceed with colonoscopy. The patient understands the nature of the planned procedure, indications, risks, alternatives and potential complications including but not limited to bleeding, infection, perforation, damage to internal organs and possible oversedation/side effects from anesthesia. The patient agrees and gives consent to proceed.  Please refer to procedure notes for findings, recommendations and patient disposition/instructions.     Benjamin Rubenstein, MD [facility]

## 2022-09-30 NOTE — Anesthesia Preprocedure Evaluation (Signed)
Anesthesia Evaluation  Patient identified by MRN, date of birth, ID band Patient awake    Reviewed: Allergy & Precautions, NPO status , Patient's Chart, lab work & pertinent test results  History of Anesthesia Complications Negative for: history of anesthetic complications  Airway Mallampati: III  TM Distance: >3 FB Neck ROM: full    Dental  (+) Chipped   Pulmonary neg shortness of breath, sleep apnea    Pulmonary exam normal        Cardiovascular Exercise Tolerance: Good hypertension, (-) angina negative cardio ROS Normal cardiovascular exam     Neuro/Psych  Headaches, Seizures -,   Anxiety      Neuromuscular disease  negative psych ROS   GI/Hepatic negative GI ROS, Neg liver ROS,,,  Endo/Other  negative endocrine ROS    Renal/GU negative Renal ROS  negative genitourinary   Musculoskeletal   Abdominal   Peds  Hematology negative hematology ROS (+)   Anesthesia Other Findings Past Medical History: No date: Allergic rhinitis No date: Anginal pain ([facility]) No date: Blood in stool No date: Cervical radiculopathy at C6 No date: Chickenpox No date: Generalized anxiety disorder No date: Headache No date: Hemorrhoids No date: High blood pressure 1998: Seizures ([place])     Comment:  diagnosed in 2000. last seizure 2016 No date: Sleep apnea No date: UTI (lower urinary tract infection)  Past Surgical History: 06/13/2022: ANTERIOR CERVICAL DECOMP/DISCECTOMY FUSION; N/A     Comment:  Procedure: C5-6 ANTERIOR CERVICAL DISCECTOMY AND FUSION               (GLOBUS HEDRON);  Surgeon: Meade Maw, MD;                Location: [facility] ORS;  Service: Neurosurgery;  Laterality:               N/A; 12/04/2015: COLONOSCOPY WITH PROPOFOL; N/A     Comment:  Procedure: COLONOSCOPY WITH PROPOFOL;  Surgeon: Manya Silvas, MD;  Location: [facility] ENDOSCOPY;  Service:               Endoscopy;  Laterality: N/A; No date:  RECONSTRUCTION OF NOSE     Comment:  fracture 2000: SHOULDER SURGERY; Right     Comment:  bicep repair  BMI    Body Mass Index: 29.12 kg/m      Reproductive/Obstetrics negative OB ROS                             Anesthesia Physical Anesthesia Plan  ASA: 3  Anesthesia Plan: General   Post-op Pain Management:    Induction: Intravenous  PONV Risk Score and Plan: Propofol infusion and TIVA  Airway Management Planned: Natural Airway and Nasal Cannula  Additional Equipment:   Intra-op Plan:   Post-operative Plan:   Informed Consent: I have reviewed the patients History and Physical, chart, labs and discussed the procedure including the risks, benefits and alternatives for the proposed anesthesia with the patient or authorized representative who has indicated his/her understanding and acceptance.     Dental Advisory Given  Plan Discussed with: Anesthesiologist, CRNA and Surgeon  Anesthesia Plan Comments: (Patient consented for risks of anesthesia including but not limited to:  - adverse reactions to medications - risk of airway placement if required - damage to eyes, teeth, lips or other oral mucosa - nerve damage due to positioning  - sore throat  or hoarseness - Damage to heart, brain, nerves, lungs, other parts of body or loss of life  Patient voiced understanding.)       Anesthesia Quick Evaluation

## 2022-09-30 NOTE — Op Note (Signed)
[facility] Patient Name: Benjamin Andrews Procedure Date: 09/30/2022 10:21 AM MRN: [id number] Account #: [id number] Date of Birth: 07/17/1966 Admit Type: Outpatient Age: 56 Room: [facility] ENDO ROOM 3 Gender: Male Note Status: Finalized Instrument Name: Park [id number] [id number] Procedure:             Colonoscopy Indications:           High risk colon cancer surveillance: Personal history                         of non-advanced adenoma, Last colonoscopy 1 year ago Providers:             Andrey Farmer MD, MD Referring MD:          Angela Adam. Caryl Bis (Referring MD) Medicines:             Monitored Anesthesia Care Complications:         No immediate complications. Procedure:             Pre-Anesthesia Assessment:                        - Prior to the procedure, a History and Physical was                         performed, and patient medications and allergies were                         reviewed. The patient is competent. The risks and                         benefits of the procedure and the sedation options and                         risks were discussed with the patient. All questions                         were answered and informed consent was obtained.                         Patient identification and proposed procedure were                         verified by the physician, the nurse, the                         anesthesiologist, the anesthetist and the technician                         in the endoscopy suite. Mental Status Examination:                         alert and oriented. Airway Examination: normal                         oropharyngeal airway and neck mobility. Respiratory                         Examination: clear to auscultation. CV Examination:  normal. Prophylactic Antibiotics: The patient does not                         require prophylactic antibiotics. Prior                         Anticoagulants: The patient  has taken no anticoagulant                         or antiplatelet agents. ASA Grade Assessment: III - A                         patient with severe systemic disease. After reviewing                         the risks and benefits, the patient was deemed in                         satisfactory condition to undergo the procedure. The                         anesthesia plan was to use monitored anesthesia care                         (MAC). Immediately prior to administration of                         medications, the patient was re-assessed for adequacy                         to receive sedatives. The heart rate, respiratory                         rate, oxygen saturations, blood pressure, adequacy of                         pulmonary ventilation, and response to care were                         monitored throughout the procedure. The physical                         status of the patient was re-assessed after the                         procedure.                        After obtaining informed consent, the colonoscope was                         passed under direct vision. Throughout the procedure,                         the patient's blood pressure, pulse, and oxygen                         saturations were monitored continuously. The  Colonoscope was introduced through the anus and                         advanced to the the cecum, identified by appendiceal                         orifice and ileocecal valve. The colonoscopy was                         somewhat difficult due to a redundant colon.                         Successful completion of the procedure was aided by                         applying abdominal pressure. The patient tolerated the                         procedure well. The quality of the bowel preparation                         was good. The ileocecal valve, appendiceal orifice,                         and rectum were  photographed. Findings:      The perianal and digital rectal examinations were normal.      Internal hemorrhoids were found during retroflexion. The hemorrhoids       were Grade I (internal hemorrhoids that do not prolapse).      The exam was otherwise without abnormality on direct and retroflexion       views. Impression:            - Internal hemorrhoids.                        - The examination was otherwise normal on direct and                         retroflexion views.                        - No specimens collected. Recommendation:        - Discharge patient to home.                        - Resume previous diet.                        - Continue present medications.                        - Repeat colonoscopy in 10 years for surveillance.                        - Return to referring physician as previously                         scheduled. Procedure Code(s):     --- Professional ---  G0105, Colorectal cancer screening; colonoscopy on                         individual at high risk Diagnosis Code(s):     --- Professional ---                        Z86.010, Personal history of colonic polyps                        K64.0, First degree hemorrhoids CPT copyright 2022 American [employer] Association. All rights reserved. The codes documented in this report are preliminary and upon coder review may  be revised to meet current compliance requirements. Andrey Farmer MD, MD 09/30/2022 10:56:40 AM Number of Addenda: 0 Note Initiated On: 09/30/2022 10:21 AM Scope Withdrawal Time: 0 hours 9 minutes 33 seconds  Total Procedure Duration: 0 hours 15 minutes 32 seconds  Estimated Blood Loss:  Estimated blood loss: none.      [facility]

## 2022-09-30 NOTE — Transfer of Care (Signed)
Immediate Anesthesia Transfer of Care Note  Patient: Benjamin Andrews  Procedure(s) Performed: COLONOSCOPY WITH PROPOFOL  Patient Location: PACU  Anesthesia Type:General  Level of Consciousness: awake  Airway & Oxygen Therapy: Patient Spontanous Breathing  Post-op Assessment: Report given to RN and Post -op Vital signs reviewed and stable  Post vital signs: Reviewed and stable  Last Vitals:  Vitals Value Taken Time  BP 123/69 09/30/22 1056  Temp    Pulse 79 09/30/22 1056  Resp 6 09/30/22 1056  SpO2 97 % 09/30/22 1056  Vitals shown include unvalidated device data.  Last Pain:  Vitals:   09/30/22 1001  TempSrc: Temporal  PainSc: 0-No pain         Complications: No notable events documented.

## 2022-10-03 ENCOUNTER — Encounter: Payer: Self-pay | Admitting: Gastroenterology

## 2022-10-29 ENCOUNTER — Encounter: Payer: Self-pay | Admitting: Family Medicine

## 2023-01-20 ENCOUNTER — Ambulatory Visit (INDEPENDENT_AMBULATORY_CARE_PROVIDER_SITE_OTHER): Payer: BC Managed Care – PPO | Admitting: Family Medicine

## 2023-01-20 ENCOUNTER — Encounter: Payer: Self-pay | Admitting: Family Medicine

## 2023-01-20 VITALS — BP 112/70 | HR 67 | Temp 97.5°F | Ht 75.0 in | Wt 234.4 lb

## 2023-01-20 DIAGNOSIS — Z125 Encounter for screening for malignant neoplasm of prostate: Secondary | ICD-10-CM

## 2023-01-20 DIAGNOSIS — Z0001 Encounter for general adult medical examination with abnormal findings: Secondary | ICD-10-CM

## 2023-01-20 DIAGNOSIS — Z23 Encounter for immunization: Secondary | ICD-10-CM | POA: Diagnosis not present

## 2023-01-20 DIAGNOSIS — E663 Overweight: Secondary | ICD-10-CM

## 2023-01-20 DIAGNOSIS — N5319 Other ejaculatory dysfunction: Secondary | ICD-10-CM | POA: Diagnosis not present

## 2023-01-20 DIAGNOSIS — I861 Scrotal varices: Secondary | ICD-10-CM

## 2023-01-20 DIAGNOSIS — E7800 Pure hypercholesterolemia, unspecified: Secondary | ICD-10-CM

## 2023-01-20 NOTE — Assessment & Plan Note (Signed)
Patient will see urology next week and discuss this with them.  He was advised to seek medical attention if he has bleeding that he cannot get stopped.

## 2023-01-20 NOTE — Assessment & Plan Note (Signed)
Likely related to the finasteride though it does persist several weeks after stopping finasteride.  He will discuss this further with urology next week.

## 2023-01-20 NOTE — Progress Notes (Signed)
Marikay AlarEric Bexley Laubach, MD Phone: [phone]  Benjamin Andrews is a 57 y.o. male who presents today for CPE.  Diet: generally eats a diabetic diet as his wife is diabetic, not much junk Exercise: walks with golf, has been doing body weight exercises since his neck surgery Colonoscopy: 09/30/22 1 year recall Prostate cancer screening: due Family history-  Prostate cancer: no  Colon cancer: maternal uncle had rectal cancer Vaccines-   Flu: out of season  Tetanus: due  Shingles: UTD  COVID19: x4 HIV screening: UTD Hep C Screening: UTD Tobacco use: no Alcohol use: no Illicit Drug use: no Dentist: yes Ophthalmology: no Patient notes inability to ejaculate since going on finasteride through an online provider.  He stopped the finasteride 2 weeks ago and still cannot ejaculate.  Patient also reports varicose veins on his scrotum that randomly bleeds.  He has to apply pressure for quite some time to get it to stop.  Patient notes he does see urology next week.   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   Resolved Ambulatory Problems    Diagnosis Date Noted   No Resolved Ambulatory Problems   Past Medical History:  Diagnosis Date   Allergic rhinitis    Anginal pain    Blood in stool    Cervical radiculopathy at C6    Chickenpox    Headache    Hemorrhoids    High blood pressure    Sleep apnea    UTI (lower urinary tract infection)     Family History  Problem Relation Age of Onset   Alcoholism Other        Grandparent   Arthritis Other         Grandparent   Stroke Other        Parent   Diabetes Other        Grandparent   Rectal cancer Maternal Uncle    Cancer Mother        unsure - started in back   Healthy Father     Social History   Socioeconomic History   Marital status: Married    Spouse name: Jeanelle MallingCatherine Burns   Number of children: 1   Years of education: some college   Highest education level: Not on file  Occupational History   Occupation: Chartered certified accountantmachinist  Tobacco Use   Smoking status: Never   Smokeless tobacco: Former    Types: Associate ProfessorChew  Vaping Use   Vaping Use: Never used  Substance and Sexual Activity   Alcohol use: Yes    Comment: occassional (once a month)   Drug use: No   Sexual activity: Yes  Other Topics Concern   Not on file  Social History Narrative   Lives at home with his significant other.   Two cups caffeine per day.   Social Determinants of Health   Financial Resource Strain: Not on file  Food Insecurity: Not on file  Transportation Needs: Not on file  Physical Activity: Not on file  Stress: Not on file  Social Connections: Not on file  Intimate Partner Violence: Not on file  ROS  General:  Negative for nexplained weight loss, fever Skin: Negative for new or changing mole, sore that won't heal HEENT: Negative for trouble hearing, trouble seeing, ringing in ears, mouth sores, hoarseness, change in voice, dysphagia. CV:  Negative for chest pain, dyspnea, edema, palpitations Resp: Negative for cough, dyspnea, hemoptysis GI: Negative for nausea, vomiting, diarrhea, constipation, abdominal pain, melena, hematochezia. GU: Negative for dysuria, incontinence, urinary hesitance, hematuria, vaginal or penile discharge, polyuria, sexual difficulty, lumps in testicle or breasts MSK: Negative for muscle cramps or aches, joint pain or swelling Neuro: Negative for headaches, weakness, numbness, dizziness, passing out/fainting Psych: Negative for depression, anxiety, memory  problems  Objective  Physical Exam Vitals:   01/20/23 1408  BP: 112/70  Pulse: 67  Temp: (!) 97.5 F (36.4 C)  SpO2: 97%    BP Readings from Last 3 Encounters:  01/20/23 112/70  09/30/22 135/83  09/27/22 137/76   Wt Readings from Last 3 Encounters:  01/20/23 234 lb 6.4 oz (106.3 kg)  09/30/22 233 lb (105.7 kg)  09/27/22 246 lb 8 oz (111.8 kg)    Physical Exam Constitutional:      General: He is not in acute distress.    Appearance: He is not diaphoretic.  HENT:     Head: Normocephalic and atraumatic.  Cardiovascular:     Rate and Rhythm: Normal rate and regular rhythm.     Heart sounds: Normal heart sounds.  Pulmonary:     Effort: Pulmonary effort is normal.     Breath sounds: Normal breath sounds.  Abdominal:     General: Bowel sounds are normal. There is no distension.     Palpations: Abdomen is soft.     Tenderness: There is no abdominal tenderness.  Genitourinary:    Comments: Varicose veins noted on left side of scrotum Musculoskeletal:     Right lower leg: No edema.     Left lower leg: No edema.  Lymphadenopathy:     Cervical: No cervical adenopathy.  Skin:    General: Skin is warm and dry.  Neurological:     Mental Status: He is alert.  Psychiatric:        Mood and Affect: Mood normal.      Assessment/Plan:   Encounter for general adult medical examination with abnormal findings Assessment & Plan: Physical exam completed.  Encouraged healthy diet and exercise.  PSA will be done for prostate cancer screening.  Colon cancer screening is up-to-date.  Tetanus vaccine will be given today.  Patient declines further COVID vaccinations.  Lab work as outlined.  I did encourage him to see an eye doctor yearly.   Ejaculatory disorder Assessment & Plan: Likely related to the finasteride though it does persist several weeks after stopping finasteride.  He will discuss this further with urology next week.   Varicose vein of scrotum Assessment &  Plan: Patient will see urology next week and discuss this with them.  He was advised to seek medical attention if he has bleeding that he cannot get stopped.   Elevated LDL cholesterol level -     Lipid panel -     Comprehensive metabolic panel  Overweight -     Hemoglobin A1c  Prostate cancer screening -     PSA  Encounter for administration of vaccine -     Td vaccine greater than or equal to 7yo preservative free IM    Return in about 1 year (around 01/20/2024) for physical.   Marikay Alar, MD [facility]  [place]

## 2023-01-20 NOTE — Assessment & Plan Note (Signed)
Physical exam completed.  Encouraged healthy diet and exercise.  PSA will be done for prostate cancer screening.  Colon cancer screening is up-to-date.  Tetanus vaccine will be given today.  Patient declines further COVID vaccinations.  Lab work as outlined.  I did encourage him to see an eye doctor yearly.

## 2023-01-20 NOTE — Patient Instructions (Signed)
Nice to see you. We will contact you with your lab results. Please discuss your ejaculation issue and the veins on your scrotum with your urologist next week when you see them. If you ever have bleeding from the veins on your scrotum and you cannot get it stopped quickly please seek medical attention in the emergency department.

## 2023-01-21 LAB — COMPREHENSIVE METABOLIC PANEL
AG Ratio: 1.8 (calc) (ref 1.0–2.5)
ALT: 59 U/L — ABNORMAL HIGH (ref 9–46)
AST: 39 U/L — ABNORMAL HIGH (ref 10–35)
Albumin: 4.5 g/dL (ref 3.6–5.1)
Alkaline phosphatase (APISO): 88 U/L (ref 35–144)
BUN: 20 mg/dL (ref 7–25)
CO2: 28 mmol/L (ref 20–32)
Calcium: 9.6 mg/dL (ref 8.6–10.3)
Chloride: 103 mmol/L (ref 98–110)
Creat: 1.01 mg/dL (ref 0.70–1.30)
Globulin: 2.5 g/dL (calc) (ref 1.9–3.7)
Glucose, Bld: 78 mg/dL (ref 65–99)
Potassium: 4.1 mmol/L (ref 3.5–5.3)
Sodium: 140 mmol/L (ref 135–146)
Total Bilirubin: 0.6 mg/dL (ref 0.2–1.2)
Total Protein: 7 g/dL (ref 6.1–8.1)

## 2023-01-21 LAB — LIPID PANEL
Cholesterol: 138 mg/dL (ref <200)
HDL: 39 mg/dL — ABNORMAL LOW (ref >=40)
LDL Cholesterol (Calc): 77 mg/dL (calc)
Non-HDL Cholesterol (Calc): 99 mg/dL (calc) (ref <130)
Total CHOL/HDL Ratio: 3.5 (calc) (ref <5.0)
Triglycerides: 138 mg/dL (ref <150)

## 2023-01-21 LAB — HEMOGLOBIN A1C
Hgb A1c MFr Bld: 5.6 % of total Hgb (ref <5.7)
Mean Plasma Glucose: 114 mg/dL
eAG (mmol/L): 6.3 mmol/L

## 2023-01-21 LAB — PSA: PSA: 1.47 ng/mL (ref <=4.00)

## 2023-01-23 ENCOUNTER — Telehealth: Payer: Self-pay

## 2023-01-23 ENCOUNTER — Other Ambulatory Visit: Payer: Self-pay | Admitting: Family Medicine

## 2023-01-23 DIAGNOSIS — R7989 Other specified abnormal findings of blood chemistry: Secondary | ICD-10-CM

## 2023-01-23 NOTE — Telephone Encounter (Signed)
Left message to call the office back.

## 2023-01-23 NOTE — Telephone Encounter (Signed)
-----   Message from Glori Luis, MD sent at 01/23/2023  9:50 AM EDT ----- Please let the patient know his liver enzymes remain mildly elevated.  During his visit he noted he did not drink any alcohol and he noted no abdominal pain.  Has he been taking Tylenol?  This could be related to his Crestor.  It may be worthwhile having him stop the Crestor for a few weeks and rechecking his liver enzymes to see if they improve.  His other lab work is acceptable.

## 2023-01-26 ENCOUNTER — Ambulatory Visit (INDEPENDENT_AMBULATORY_CARE_PROVIDER_SITE_OTHER): Payer: BC Managed Care – PPO | Admitting: Urology

## 2023-01-26 ENCOUNTER — Encounter: Payer: Self-pay | Admitting: Urology

## 2023-01-26 VITALS — BP 134/87 | HR 79 | Ht 75.0 in | Wt 235.0 lb

## 2023-01-26 DIAGNOSIS — N401 Enlarged prostate with lower urinary tract symptoms: Secondary | ICD-10-CM | POA: Diagnosis not present

## 2023-01-26 DIAGNOSIS — N138 Other obstructive and reflux uropathy: Secondary | ICD-10-CM

## 2023-01-26 DIAGNOSIS — Z87898 Personal history of other specified conditions: Secondary | ICD-10-CM

## 2023-01-26 DIAGNOSIS — R972 Elevated prostate specific antigen [PSA]: Secondary | ICD-10-CM

## 2023-01-26 MED ORDER — ALFUZOSIN HCL ER 10 MG PO TB24: 10.0000 mg | ORAL_TABLET | Freq: Every day | ORAL | 3 refills | Status: DC

## 2023-01-26 MED ORDER — TADALAFIL 5 MG PO TABS: 5.0000 mg | ORAL_TABLET | Freq: Every day | ORAL | 3 refills | Status: DC | PRN

## 2023-01-26 NOTE — Progress Notes (Signed)
I, Benjamin Andrews,acting as a scribe for Benjamin AltesScott C Kelcy Baeten, MD.,have documented all relevant documentation on the behalf of Benjamin AltesScott C Kilani [name], MD,as directed by  Benjamin AltesScott C Travaughn Vue, MD while in the presence of Benjamin AltesScott C Shamond Skelton, MD.   01/26/23 3:54 PM   Benjamin Andrews Grime June 24, 1966 [id number]  Referring provider: Glori LuisSonnenberg, Eric G, MD [address] [place] [id number]  Chief Complaint  Patient presents with   Other    HPI: Benjamin Benjamin Andrews Benjamin Andrews is a 57 y.o. male present to transfer urologic care after retirement of his previous urologist.  Last saw Dr. Evelene CroonWolff 08/12/22 and was followed for BPH and chronic prostatitis on Alfuzosin/Dutasteride.  Prior history of elevated PSA 7.8 and prostate MRI showed a 55 gram gland without lesions suspicious for high-grade prostate cancer. Recently, he has experienced increasing nocturia which has improved since starting tadalafil in November History of sleep apnea, previously managed with CPAP   PMH: Past Medical History:  Diagnosis Date   Allergic rhinitis    Anginal pain    Blood in stool    Cervical radiculopathy at C6    Chickenpox    Generalized anxiety disorder    Headache    Hemorrhoids    High blood pressure    Seizures 1998   diagnosed in 2000. last seizure 2016   Sleep apnea    UTI (lower urinary tract infection)     Surgical History: Past Surgical History:  Procedure Laterality Date   ANTERIOR CERVICAL DECOMP/DISCECTOMY FUSION N/A 06/13/2022   Procedure: C5-6 ANTERIOR CERVICAL DISCECTOMY AND FUSION (GLOBUS HEDRON);  Surgeon: Venetia NightYarbrough, Chester, MD;  Location: [facility] ORS;  Service: Neurosurgery;  Laterality: N/A;   COLONOSCOPY WITH PROPOFOL N/A 12/04/2015   Procedure: COLONOSCOPY WITH PROPOFOL;  Surgeon: Scot Junobert T Elliott, MD;  [facility];  Service: Endoscopy;  Laterality: N/A;   COLONOSCOPY WITH PROPOFOL N/A 09/30/2022   Procedure: COLONOSCOPY WITH PROPOFOL;  Surgeon: Regis BillLocklear, Cameron T, MD;  Location: [facility]  ENDOSCOPY;  Service: Endoscopy;  Laterality: N/A;   RECONSTRUCTION OF NOSE     fracture   SHOULDER SURGERY Right 2000   bicep repair    Home Medications:  Allergies as of 01/26/2023   No Known Allergies      Medication List        Accurate as of January 26, 2023  3:54 PM. If you have any questions, ask your nurse or doctor.          alfuzosin 10 MG 24 hr tablet Commonly known as: UROXATRAL Take 1 tablet (10 mg total) by mouth daily with breakfast.   BIOTIN PO Take 1 tablet by mouth daily.   busPIRone 10 MG tablet Commonly known as: BUSPAR Take 1 tablet by mouth twice daily   COPPER PO Take 1 tablet by mouth daily.   HYALURONIC ACID PO Take 1 tablet by mouth daily.   JOINT HEALTH PO Take 1 tablet by mouth daily.   MAGNESIUM PO Take 3 tablets by mouth daily.   multivitamin tablet Take 1 tablet by mouth daily.   OMEGA-3 FATTY ACIDS PO Take 1 tablet by mouth daily.   Oxcarbazepine 300 MG tablet Commonly known as: TRILEPTAL Take 1 tablet (300 mg total) by mouth 2 (two) times daily.   rosuvastatin 20 MG tablet Commonly known as: CRESTOR Take 1 tablet (20 mg total) by mouth daily.   tadalafil 5 MG tablet Commonly known as: CIALIS Take 1 tablet (5 mg total) by mouth daily as needed for erectile dysfunction.  Started by: Benjamin Altes, MD   vitamin C 100 MG tablet Take 100 mg by mouth daily.   VITAMIN D PO Take 1 tablet by mouth daily.   ZINC PO Take 1 tablet by mouth daily.        Allergies: No Known Allergies  Family History: Family History  Problem Relation Age of Onset   Alcoholism Other        Grandparent   Arthritis Other        Grandparent   Stroke Other        Parent   Diabetes Other        Grandparent   Rectal cancer Maternal Uncle    Cancer Mother        unsure - started in back   Healthy Father     Social History:  reports that he has never smoked. He has quit using smokeless tobacco.  His smokeless tobacco use included  chew. He reports current alcohol use. He reports that he does not use drugs.   Physical Exam: BP 134/87   Pulse 79   Ht 6\' 3"  (1.905 m)   Wt 235 lb (106.6 kg)   BMI 29.37 kg/m   Constitutional:  Alert and oriented, No acute distress. HEENT: [place] AT Respiratory: Normal respiratory effort, no increased work of breathing. Psychiatric: Normal mood and affect.  Laboratory Data:  Lab Results  Component Value Date   CREATININE 1.01 01/20/2023    Lab Results  Component Value Date   PSA 1.47 01/20/2023   PSA 2.54 01/14/2022   PSA 2.35 01/08/2021     Assessment & Plan:    BPH with LUTS Symptoms stable on Alfuzosin which he will continue-refilled We also discussed Tadalafil 5 mg daily is approved for BPH and he may be receiving benefit from this also.   History of elevated PSA Most recent PSA stable at 1.47 One year follow-up Instructed to call earlier for any changes in symptoms  I have reviewed the above documentation for accuracy and completeness, and I agree with the above.   Benjamin Altes, MD  [facility] [address], [address] [place], [place] [address] [phone]

## 2023-01-28 ENCOUNTER — Encounter: Payer: Self-pay | Admitting: Urology

## 2023-02-10 ENCOUNTER — Other Ambulatory Visit (INDEPENDENT_AMBULATORY_CARE_PROVIDER_SITE_OTHER): Payer: BC Managed Care – PPO

## 2023-02-10 DIAGNOSIS — R7989 Other specified abnormal findings of blood chemistry: Secondary | ICD-10-CM | POA: Diagnosis not present

## 2023-02-10 DIAGNOSIS — E782 Mixed hyperlipidemia: Secondary | ICD-10-CM

## 2023-02-10 NOTE — Addendum Note (Signed)
Addended by: Maricarmen Braziel S on: 02/10/2023 02:22 PM   Modules accepted: Orders  

## 2023-02-11 LAB — HEPATIC FUNCTION PANEL
ALT: 43 IU/L (ref 0–44)
AST: 33 IU/L (ref 0–40)
Albumin: 4.6 g/dL (ref 3.8–4.9)
Alkaline Phosphatase: 100 IU/L (ref 44–121)
Bilirubin Total: 0.4 mg/dL (ref 0.0–1.2)
Bilirubin, Direct: 0.1 mg/dL (ref 0.00–0.40)
Total Protein: 6.9 g/dL (ref 6.0–8.5)

## 2023-02-13 ENCOUNTER — Other Ambulatory Visit: Payer: Self-pay | Admitting: Family Medicine

## 2023-02-13 DIAGNOSIS — E785 Hyperlipidemia, unspecified: Secondary | ICD-10-CM

## 2023-02-13 MED ORDER — ROSUVASTATIN CALCIUM 10 MG PO TABS
10.0000 mg | ORAL_TABLET | Freq: Every day | ORAL | 0 refills | Status: DC
Start: 2023-02-13 — End: 2023-05-10

## 2023-02-13 NOTE — Addendum Note (Signed)
Addended by: Lonia Blood on: 02/13/2023 01:30 PM   Modules accepted: Orders

## 2023-02-20 ENCOUNTER — Ambulatory Visit: Payer: Self-pay | Admitting: Urology

## 2023-02-28 ENCOUNTER — Other Ambulatory Visit: Payer: Self-pay | Admitting: Orthopedic Surgery

## 2023-02-28 DIAGNOSIS — Z981 Arthrodesis status: Secondary | ICD-10-CM

## 2023-02-28 DIAGNOSIS — M4802 Spinal stenosis, cervical region: Secondary | ICD-10-CM

## 2023-02-28 NOTE — Progress Notes (Signed)
   REFERRING PHYSICIAN:  Nicholaus Corolla [address] [place],  [place] [address]  DOS: 06/13/22 ACDF C5-C6  HISTORY OF PRESENT ILLNESS: Benjamin Andrews is status post ACDF C5-C6.   Last seen by Dr. Myer Haff on 09/01/22 and he was doing well. Was released back to work.   He is doing well with only occasional pain in his neck. No arm pain. Going to gym 3 times a week and he is back to golf.    He is back at work.    PHYSICAL EXAMINATION:  NEUROLOGICAL:  General: In no acute distress.   Awake, alert, oriented to person, place, and time.  Pupils equal round and reactive to light.  Facial tone is symmetric.    Strength: Side Biceps Triceps Deltoid Interossei Grip Wrist Ext. Wrist Flex.  R 5 5 5 5 5 5 5   L 5 5 5 5 5 5 5    Incision well healed   Imaging:  Cervical xrays dated 03/03/23:  No complications noted.   Radiology report not yet available.    Assessment / Plan: Benjamin Andrews is doing well s/p above surgery. His preop right arm weakness is improving and he has minimal pain. Treatment options reviewed with patient and following plan made:   - Continue with activity as tolerated. He still avoids heavy lifting at work and overhead.  - Follow up in 3 months with repeat xrays. If doing well then, he can be released to follow up prn.   BP was elevated, it was 144/94. He does not have HTN. No symptoms of chest pain, shortness of breath, blurry vision, or headaches. Recheck BP was 142/86. He was advised to check his BP at home and call PCP if not improved. If he develops CP, SOB, blurry vision, or headaches, then he will go to ED.     Advised to contact the office if any questions or concerns arise.   Drake Leach PA-C Dept of Neurosurgery

## 2023-03-02 ENCOUNTER — Ambulatory Visit
Admission: RE | Admit: 2023-03-02 | Discharge: 2023-03-02 | Disposition: A | Discharge status: Home / Self Care | Payer: BC Managed Care – PPO | Source: Ambulatory Visit | Attending: Orthopedic Surgery | Admitting: Orthopedic Surgery

## 2023-03-02 ENCOUNTER — Ambulatory Visit
Admission: RE | Admit: 2023-03-02 | Discharge: 2023-03-02 | Disposition: A | Discharge status: Home / Self Care | Payer: BC Managed Care – PPO | Attending: Orthopedic Surgery | Admitting: Orthopedic Surgery

## 2023-03-02 DIAGNOSIS — M47812 Spondylosis without myelopathy or radiculopathy, cervical region: Secondary | ICD-10-CM | POA: Diagnosis not present

## 2023-03-02 DIAGNOSIS — M4802 Spinal stenosis, cervical region: Secondary | ICD-10-CM | POA: Diagnosis not present

## 2023-03-02 DIAGNOSIS — Z981 Arthrodesis status: Secondary | ICD-10-CM | POA: Diagnosis not present

## 2023-03-03 ENCOUNTER — Encounter: Payer: Self-pay | Admitting: Orthopedic Surgery

## 2023-03-03 ENCOUNTER — Ambulatory Visit (INDEPENDENT_AMBULATORY_CARE_PROVIDER_SITE_OTHER): Payer: BC Managed Care – PPO | Admitting: Orthopedic Surgery

## 2023-03-03 VITALS — BP 142/86 | Wt 234.6 lb

## 2023-03-03 DIAGNOSIS — Z981 Arthrodesis status: Secondary | ICD-10-CM

## 2023-03-03 DIAGNOSIS — Z09 Encounter for follow-up examination after completed treatment for conditions other than malignant neoplasm: Secondary | ICD-10-CM

## 2023-03-03 DIAGNOSIS — M5412 Radiculopathy, cervical region: Secondary | ICD-10-CM

## 2023-03-03 DIAGNOSIS — M4802 Spinal stenosis, cervical region: Secondary | ICD-10-CM

## 2023-03-03 NOTE — Patient Instructions (Signed)
Your blood pressure was elevated today, it was 144/94 and recheck was 142/86. I want you to recheck it at home and follow up with your PCP if it remains high. If you have any chest pain, shortness of breath, blurry vision, or headaches then you need to go to ED.    I will see you back in 3 months. You will need xrays prior to that visit (like you did today).   Call or message me if you need anything.   Kennyth Arnold  [phone]

## 2023-03-29 ENCOUNTER — Other Ambulatory Visit (INDEPENDENT_AMBULATORY_CARE_PROVIDER_SITE_OTHER): Payer: BC Managed Care – PPO

## 2023-03-29 DIAGNOSIS — E785 Hyperlipidemia, unspecified: Secondary | ICD-10-CM | POA: Diagnosis not present

## 2023-03-30 LAB — LDL CHOLESTEROL, DIRECT: Direct LDL: 80 mg/dL

## 2023-03-30 LAB — HEPATIC FUNCTION PANEL
ALT: 56 U/L — ABNORMAL HIGH (ref 0–53)
AST: 42 U/L — ABNORMAL HIGH (ref 0–37)
Albumin: 4.2 g/dL (ref 3.5–5.2)
Alkaline Phosphatase: 72 U/L (ref 39–117)
Bilirubin, Direct: 0.2 mg/dL (ref 0.0–0.3)
Total Bilirubin: 0.8 mg/dL (ref 0.2–1.2)
Total Protein: 6.8 g/dL (ref 6.0–8.3)

## 2023-04-05 ENCOUNTER — Other Ambulatory Visit: Payer: Self-pay | Admitting: *Deleted

## 2023-04-05 DIAGNOSIS — R748 Abnormal levels of other serum enzymes: Secondary | ICD-10-CM

## 2023-04-24 ENCOUNTER — Other Ambulatory Visit (INDEPENDENT_AMBULATORY_CARE_PROVIDER_SITE_OTHER): Payer: BC Managed Care – PPO

## 2023-04-24 DIAGNOSIS — R748 Abnormal levels of other serum enzymes: Secondary | ICD-10-CM

## 2023-04-25 LAB — HEPATIC FUNCTION PANEL
ALT: 49 U/L (ref 0–53)
AST: 41 U/L — ABNORMAL HIGH (ref 0–37)
Albumin: 4.5 g/dL (ref 3.5–5.2)
Alkaline Phosphatase: 86 U/L (ref 39–117)
Bilirubin, Direct: 0.1 mg/dL (ref 0.0–0.3)
Total Bilirubin: 0.5 mg/dL (ref 0.2–1.2)
Total Protein: 6.9 g/dL (ref 6.0–8.3)

## 2023-04-26 ENCOUNTER — Telehealth: Payer: Self-pay

## 2023-04-26 NOTE — Telephone Encounter (Signed)
Pt called back and I read the message to him and he is scheduled for a lab appointment on 10/14

## 2023-04-26 NOTE — Telephone Encounter (Signed)
Left message to call the office back regarding his lab results below.

## 2023-04-26 NOTE — Telephone Encounter (Signed)
-----   Message from Glori Luis, MD sent at 04/25/2023  3:28 PM EDT ----- Please of the patient know that his liver enzymes have improved some.  One of them remains minimally elevated.  I would suggest that we continue to monitor these with him on the Crestor.  We can plan on rechecking in 3 months.

## 2023-04-28 ENCOUNTER — Telehealth: Payer: Self-pay

## 2023-04-28 NOTE — Telephone Encounter (Signed)
Left message to call the office back regarding Dr. Sonnenberg's recommendations 

## 2023-04-28 NOTE — Telephone Encounter (Signed)
-----   Message from Marikay Alar sent at 04/28/2023  3:10 PM EDT ----- Eating a generally healthy diet with plenty of fruits and vegetables would be good.  He should try to minimize cholesterol and fatty food intake.  He should minimize fried food intake.  He should minimize sugary food intake.

## 2023-04-28 NOTE — Telephone Encounter (Signed)
Pt returned Elizabeth CMA call. Note below was read to him. Pt aware and understood. 

## 2023-05-01 NOTE — Telephone Encounter (Signed)
 Noted  

## 2023-05-10 ENCOUNTER — Other Ambulatory Visit: Payer: Self-pay | Admitting: Family Medicine

## 2023-05-10 DIAGNOSIS — E782 Mixed hyperlipidemia: Secondary | ICD-10-CM

## 2023-06-02 ENCOUNTER — Ambulatory Visit: Payer: BC Managed Care – PPO | Admitting: Orthopedic Surgery

## 2023-07-27 ENCOUNTER — Telehealth: Payer: Self-pay | Admitting: Family Medicine

## 2023-07-27 DIAGNOSIS — R7989 Other specified abnormal findings of blood chemistry: Secondary | ICD-10-CM

## 2023-07-27 NOTE — Telephone Encounter (Signed)
Patient need lab orders.

## 2023-07-28 NOTE — Telephone Encounter (Signed)
Labs ordered.

## 2023-07-31 ENCOUNTER — Other Ambulatory Visit (INDEPENDENT_AMBULATORY_CARE_PROVIDER_SITE_OTHER): Payer: BC Managed Care – PPO

## 2023-07-31 DIAGNOSIS — R7989 Other specified abnormal findings of blood chemistry: Secondary | ICD-10-CM

## 2023-07-31 LAB — HEPATIC FUNCTION PANEL
ALT: 34 U/L (ref 0–53)
AST: 35 U/L (ref 0–37)
Albumin: 4.4 g/dL (ref 3.5–5.2)
Alkaline Phosphatase: 71 U/L (ref 39–117)
Bilirubin, Direct: 0.1 mg/dL (ref 0.0–0.3)
Total Bilirubin: 0.7 mg/dL (ref 0.2–1.2)
Total Protein: 7.1 g/dL (ref 6.0–8.3)

## 2023-08-06 ENCOUNTER — Encounter: Payer: Self-pay | Admitting: Family Medicine

## 2023-08-07 NOTE — Telephone Encounter (Signed)
Recorded in Patient's medical record.

## 2023-08-12 ENCOUNTER — Other Ambulatory Visit: Payer: Self-pay | Admitting: Family Medicine

## 2023-08-12 DIAGNOSIS — E782 Mixed hyperlipidemia: Secondary | ICD-10-CM

## 2023-09-15 ENCOUNTER — Other Ambulatory Visit: Payer: Self-pay | Admitting: Family Medicine

## 2023-09-25 DIAGNOSIS — M25561 Pain in right knee: Secondary | ICD-10-CM | POA: Diagnosis not present

## 2023-09-27 NOTE — Progress Notes (Unsigned)
PATIENT: Benjamin Andrews DOB: [birth year]  REASON FOR VISIT: follow up HISTORY FROM: patient Primary Neurologist: Dr. Terrace Arabia   HISTORY  Benjamin Andrews is a 57 year old male, seen in request by his primary care physician Dr. Marikay Alar for evaluation of seizure   I have reviewed and summarized the referring note from the referring physician.  He had a history of anxiety, taking Buspar 10mg  bid, anxiety, also history of nocturnal seizure, taking Trileptal 300mg  bid.used to have sleep apnea, he lost 20 pounds recently, is no longer using CPAP machine   I was able to review his previous neurologist's note, Dr. Marianna Fuss from [facility], most recent office note was on July 11, 2018, he has retired.  Patient reported a history of nocturnal seizure since 1998, there was a delayed diagnosis, he presented with right shoulder dislocation 3 times after woke up from overnight sleep, eventually had right shoulder surgery, shortly following his right shoulder surgery, he had a witnessed nocturnal generalized tonic-clonic seizure in 2000   Per patient, EEG, and MRI showed no significant abnormality, he was treated with a different antiepileptic medication, could not tolerated, then he was treated with Trileptal 150 mg 3 times a day, complains of daytime sleepiness, he has been on stable dose Trileptal 150mg  2 times a day for a long time, in 2015, in attempt to wean off Trileptal, he suffered another recurrent nocturnal seizure, woke up with whole-body muscle achy pain   He works as a Chartered certified accountant, does not want to have MRI of the brain, or EEG at this point   His maternal aunt, and maternal great grandmother suffered epilepsy,  Update October 01, 2020 SS: Here today for routine follow-up, continues to do well, last seizure was 3 years ago when he forgot to take his medication, has nocturnal seizures only. Knows had seizure because wakes up really sore.  Remains on Trileptal 300 mg twice a day.   Tolerates well.  Previously had MRI of the brain and EEG with prior neurologist.  Continues to do well.  Works as a Chartered certified accountant.  Update September 30, 2021 SS: Continues to do well, no recurrent seizure, remains on Trileptal 300 mg twice a day.  Tolerates well.  Continues to work full-time as a Chartered certified accountant, he has the rest of the year off.  Leaving today to travel to [facility] to see his ill mother.   Update September 27, 2022 SS: Injured his neck after heavy lifting, In August had C5-6 discectomy and fusion at [facility] regional with Dr. Myer Haff. He did very well. He just went back to work few weeks ago. Denies any seizures. Remains on Trileptal. Labs 09/30/22 platelets 132, normal CMP, Trileptal level 6. Has been 5 years since last seizure.   Update September 28, 2023 SS: Labs at last visit showed AST 46, ALT 57, Trileptal level 5.  Recent hepatic function panel October 2024 showed normal AST, ALT. No seizures. Remains on Trileptal 300 mg BID. Has been taking Liver detox pill OTC, reduced Crestor. Had steroid shot to right knee. Doing very good.   REVIEW OF SYSTEMS: Out of a complete 14 system review of symptoms, the patient complains only of the following symptoms, and all other reviewed systems are negative.  N/A  ALLERGIES: No Known Allergies  HOME MEDICATIONS: Outpatient Medications Prior to Visit  Medication Sig Dispense Refill   alfuzosin (UROXATRAL) 10 MG 24 hr tablet Take 1 tablet (10 mg total) by mouth daily with breakfast. 90 tablet 3   Ascorbic Acid (  VITAMIN C) 100 MG tablet Take 100 mg by mouth daily.     BIOTIN PO Take 1 tablet by mouth daily.     busPIRone (BUSPAR) 10 MG tablet Take 1 tablet by mouth twice daily 180 tablet 1   COPPER PO Take 1 tablet by mouth daily.     MAGNESIUM PO Take 3 tablets by mouth daily.     meloxicam (MOBIC) 15 MG tablet Take 15 mg by mouth daily.     Misc Natural Products (JOINT HEALTH PO) Take 1 tablet by mouth daily.     Multiple Vitamin (MULTIVITAMIN)  tablet Take 1 tablet by mouth daily.     Multiple Vitamins-Minerals (ZINC PO) Take 1 tablet by mouth daily.     OMEGA-3 FATTY ACIDS PO Take 1 tablet by mouth daily.     Oxcarbazepine (TRILEPTAL) 300 MG tablet Take 1 tablet (300 mg total) by mouth 2 (two) times daily. 180 tablet 4   rosuvastatin (CRESTOR) 10 MG tablet Take 1 tablet by mouth once daily 90 tablet 0   Sodium Hyaluronate, oral, (HYALURONIC ACID PO) Take 1 tablet by mouth daily.     tadalafil (CIALIS) 5 MG tablet Take 1 tablet (5 mg total) by mouth daily as needed for erectile dysfunction. 90 tablet 3   VITAMIN D PO Take 1 tablet by mouth daily.     acetaminophen (TYLENOL) 650 MG CR tablet Take 650 mg by mouth every 8 (eight) hours as needed for pain. (Patient not taking: Reported on 09/28/2023)     No facility-administered medications prior to visit.    PAST MEDICAL HISTORY: Past Medical History:  Diagnosis Date   Allergic rhinitis    Anginal pain (HCC)    Blood in stool    Cervical radiculopathy at C6    Chickenpox    Generalized anxiety disorder    Headache    Hemorrhoids    High blood pressure    Seizures (HCC) 1998   diagnosed in 2000. last seizure 2016   Sleep apnea    UTI (lower urinary tract infection)     PAST SURGICAL HISTORY: Past Surgical History:  Procedure Laterality Date   ANTERIOR CERVICAL DECOMP/DISCECTOMY FUSION N/A 06/13/2022   Procedure: C5-6 ANTERIOR CERVICAL DISCECTOMY AND FUSION (GLOBUS HEDRON);  Surgeon: Venetia Night, MD;  Location: [facility] ORS;  Service: Neurosurgery;  Laterality: N/A;   COLONOSCOPY WITH PROPOFOL N/A 12/04/2015   Procedure: COLONOSCOPY WITH PROPOFOL;  Surgeon: Scot Jun, MD;  Location: [facility] ENDOSCOPY;  Service: Endoscopy;  Laterality: N/A;   COLONOSCOPY WITH PROPOFOL N/A 09/30/2022   Procedure: COLONOSCOPY WITH PROPOFOL;  Surgeon: Regis Bill, MD;  Location: [facility] ENDOSCOPY;  Service: Endoscopy;  Laterality: N/A;   RECONSTRUCTION OF NOSE     fracture    SHOULDER SURGERY Right 2000   bicep repair    FAMILY HISTORY: Family History  Problem Relation Age of Onset   Alcoholism Other        Grandparent   Arthritis Other        Grandparent   Stroke Other        Parent   Diabetes Other        Grandparent   Rectal cancer Maternal Uncle    Cancer Mother        unsure - started in back   Healthy Father     SOCIAL HISTORY: Social History   Socioeconomic History   Marital status: Married    Spouse name: Jeanelle Malling   Number of children: 1  Years of education: some college   Highest education level: Associate degree: occupational, Scientist, product/process development, or vocational program  Occupational History   Occupation: Chartered certified accountant  Tobacco Use   Smoking status: Never   Smokeless tobacco: Former    Types: Associate Professor status: Never Used  Substance and Sexual Activity   Alcohol use: Yes    Comment: occassional (once a month)   Drug use: No   Sexual activity: Yes  Other Topics Concern   Not on file  Social History Narrative   Lives at home with his significant other.   Two cups caffeine per day.   Social Drivers of Corporate investment banker Strain: Low Risk  (02/09/2023)   Overall Financial Resource Strain (CARDIA)    Difficulty of Paying Living Expenses: Not very hard  Food Insecurity: No Food Insecurity (02/09/2023)   Hunger Vital Sign    Worried About Running Out of Food in the Last Year: Never true    Ran Out of Food in the Last Year: Never true  Transportation Needs: No Transportation Needs (02/09/2023)   PRAPARE - Administrator, Civil Service (Medical): No    Lack of Transportation (Non-Medical): No  Physical Activity: Insufficiently Active (02/09/2023)   Exercise Vital Sign    Days of Exercise per Week: 2 days    Minutes of Exercise per Session: 20 min  Stress: No Stress Concern Present (02/09/2023)   [employer] of Occupational Health - Occupational Stress Questionnaire    Feeling of Stress : Only  a little  Social Connections: Unknown (02/09/2023)   Social Connection and Isolation Panel [NHANES]    Frequency of Communication with Friends and Family: Never    Frequency of Social Gatherings with Friends and Family: Never    Attends Religious Services: Patient declined    Database administrator or Organizations: No    Attends Engineer, structural: Not on file    Marital Status: Married  Catering manager Violence: Not on file    PHYSICAL EXAM  Vitals:   09/28/23 0742  BP: (!) 140/93  Pulse: (!) 56    There is no height or weight on file to calculate BMI.  Generalized: Well developed, in no acute distress   Neurological examination  Mentation: Alert oriented to time, place, history taking. Follows all commands speech and language fluent, very pleasant Cranial nerve II-XII: Pupils were equal round reactive to light. Extraocular movements were full, visual field were full on confrontational test. Facial sensation and strength were normal.  Head turning and shoulder shrug  were normal and symmetric. Motor: The motor testing reveals 5 over 5 strength of all 4 extremities. Good symmetric motor tone is noted throughout.  Sensory: Sensory testing is intact to soft touch on all 4 extremities. No evidence of extinction is noted.  Coordination: Cerebellar testing reveals good finger-nose-finger and heel-to-shin bilaterally.  Gait and station: Gait is normal.  Reflexes: Deep tendon reflexes are symmetric and normal bilaterally.   DIAGNOSTIC DATA (LABS, IMAGING, TESTING) - I reviewed patient records, labs, notes, testing and imaging myself where available.  Lab Results  Component Value Date   WBC 5.8 09/27/2022   HGB 16.3 09/27/2022   HCT 48.9 09/27/2022   MCV 90 09/27/2022   PLT 154 09/27/2022      Component Value Date/Time   NA 140 01/20/2023 1447   NA 141 09/27/2022 0811   K 4.1 01/20/2023 1447   CL 103 01/20/2023 1447  CO2 28 01/20/2023 1447   GLUCOSE 78  01/20/2023 1447   BUN 20 01/20/2023 1447   BUN 13 09/27/2022 0811   CREATININE 1.01 01/20/2023 1447   CALCIUM 9.6 01/20/2023 1447   PROT 7.1 07/31/2023 0946   PROT 6.9 02/10/2023 1526   ALBUMIN 4.4 07/31/2023 0946   ALBUMIN 4.6 02/10/2023 1526   AST 35 07/31/2023 0946   ALT 34 07/31/2023 0946   ALKPHOS 71 07/31/2023 0946   BILITOT 0.7 07/31/2023 0946   BILITOT 0.4 02/10/2023 1526   GFRNONAA >60 06/06/2022 0930   GFRAA 108 10/01/2020 0807   Lab Results  Component Value Date   CHOL 138 01/20/2023   HDL 39 (L) 01/20/2023   LDLCALC 77 01/20/2023   LDLDIRECT 80.0 03/29/2023   TRIG 138 01/20/2023   CHOLHDL 3.5 01/20/2023   Lab Results  Component Value Date   HGBA1C 5.6 01/20/2023   No results found for: "VITAMINB12" Lab Results  Component Value Date   TSH 1.34 10/07/2015   ASSESSMENT AND PLAN 57 y.o. year old male  has a past medical history of Allergic rhinitis, Anginal pain (HCC), Blood in stool, Cervical radiculopathy at C6, Chickenpox, Generalized anxiety disorder, Headache, Hemorrhoids, High blood pressure, Seizures (HCC) (1998), Sleep apnea, and UTI (lower urinary tract infection). here with:  1.  Epilepsy, last seizure 2015 -Doing very well, continue Trileptal 300 mg twice a day -Check routine labs today -Follow-up in 1 year or sooner if needed  Orders Placed This Encounter  Procedures   CBC with Differential/Platelet   CMP   10-Hydroxycarbazepine   Meds ordered this encounter  Medications   Oxcarbazepine (TRILEPTAL) 300 MG tablet    Sig: Take 1 tablet (300 mg total) by mouth 2 (two) times daily.    Dispense:  180 tablet    Refill:  4   Margie Ege, [place], [place] 09/28/2023, 7:49 AM [facility] [address] [place], [place] [address] [phone]

## 2023-09-28 ENCOUNTER — Encounter: Payer: Self-pay | Admitting: Neurology

## 2023-09-28 ENCOUNTER — Ambulatory Visit (INDEPENDENT_AMBULATORY_CARE_PROVIDER_SITE_OTHER): Payer: BC Managed Care – PPO | Admitting: Neurology

## 2023-09-28 VITALS — BP 140/93 | HR 56

## 2023-09-28 DIAGNOSIS — R569 Unspecified convulsions: Secondary | ICD-10-CM

## 2023-09-28 MED ORDER — OXCARBAZEPINE 300 MG PO TABS: 300.0000 mg | ORAL_TABLET | Freq: Two times a day (BID) | ORAL | 4 refills | Status: DC

## 2023-09-28 NOTE — Patient Instructions (Signed)
Great to see you today! Continue current settings, check labs, call for any seizures. See you back in 1 year!!

## 2023-10-03 LAB — COMPREHENSIVE METABOLIC PANEL
ALT: 34 [IU]/L (ref 0–44)
AST: 27 [IU]/L (ref 0–40)
Albumin: 4.5 g/dL (ref 3.8–4.9)
Alkaline Phosphatase: 99 [IU]/L (ref 44–121)
BUN/Creatinine Ratio: 21 — ABNORMAL HIGH (ref 9–20)
BUN: 22 mg/dL (ref 6–24)
Bilirubin Total: 0.4 mg/dL (ref 0.0–1.2)
CO2: 26 mmol/L (ref 20–29)
Calcium: 9.6 mg/dL (ref 8.7–10.2)
Chloride: 101 mmol/L (ref 96–106)
Creatinine, Ser: 1.03 mg/dL (ref 0.76–1.27)
Globulin, Total: 2.7 g/dL (ref 1.5–4.5)
Glucose: 96 mg/dL (ref 70–99)
Potassium: 4.6 mmol/L (ref 3.5–5.2)
Sodium: 141 mmol/L (ref 134–144)
Total Protein: 7.2 g/dL (ref 6.0–8.5)
eGFR: 84 mL/min/{1.73_m2} (ref >59)

## 2023-10-03 LAB — CBC WITH DIFFERENTIAL/PLATELET
Basophils Absolute: 0 10*3/uL (ref 0.0–0.2)
Basos: 0 %
EOS (ABSOLUTE): 0.4 10*3/uL (ref 0.0–0.4)
Eos: 6 %
Hematocrit: 51.1 % — ABNORMAL HIGH (ref 37.5–51.0)
Hemoglobin: 16.7 g/dL (ref 13.0–17.7)
Immature Grans (Abs): 0 10*3/uL (ref 0.0–0.1)
Immature Granulocytes: 0 %
Lymphocytes Absolute: 2.4 10*3/uL (ref 0.7–3.1)
Lymphs: 35 %
MCH: 30 pg (ref 26.6–33.0)
MCHC: 32.7 g/dL (ref 31.5–35.7)
MCV: 92 fL (ref 79–97)
Monocytes Absolute: 0.5 10*3/uL (ref 0.1–0.9)
Monocytes: 7 %
Neutrophils Absolute: 3.6 10*3/uL (ref 1.4–7.0)
Neutrophils: 52 %
Platelets: 160 10*3/uL (ref 150–450)
RBC: 5.56 x10E6/uL (ref 4.14–5.80)
RDW: 13 % (ref 11.6–15.4)
WBC: 7 10*3/uL (ref 3.4–10.8)

## 2023-10-03 LAB — 10-HYDROXYCARBAZEPINE: Oxcarbazepine SerPl-Mcnc: 6 ug/mL — ABNORMAL LOW (ref 10–35)

## 2023-10-23 DIAGNOSIS — M25561 Pain in right knee: Secondary | ICD-10-CM | POA: Diagnosis not present

## 2023-10-31 DIAGNOSIS — M25561 Pain in right knee: Secondary | ICD-10-CM | POA: Diagnosis not present

## 2023-11-15 DIAGNOSIS — M25561 Pain in right knee: Secondary | ICD-10-CM | POA: Diagnosis not present

## 2023-11-23 ENCOUNTER — Other Ambulatory Visit: Payer: Self-pay | Admitting: Urology

## 2024-01-26 ENCOUNTER — Encounter: Payer: BC Managed Care – PPO | Admitting: Family Medicine

## 2024-01-26 ENCOUNTER — Ambulatory Visit: Payer: BC Managed Care – PPO | Admitting: Nurse Practitioner

## 2024-01-26 ENCOUNTER — Encounter: Payer: Self-pay | Admitting: Urology

## 2024-01-26 ENCOUNTER — Ambulatory Visit: Payer: Self-pay | Admitting: Urology

## 2024-01-26 VITALS — BP 151/96 | HR 60 | Ht 75.0 in | Wt 240.0 lb

## 2024-01-26 VITALS — BP 120/78 | HR 61 | Temp 97.5°F | Ht 75.0 in | Wt 238.4 lb

## 2024-01-26 DIAGNOSIS — I8312 Varicose veins of left lower extremity with inflammation: Secondary | ICD-10-CM | POA: Diagnosis not present

## 2024-01-26 DIAGNOSIS — N138 Other obstructive and reflux uropathy: Secondary | ICD-10-CM

## 2024-01-26 DIAGNOSIS — Z125 Encounter for screening for malignant neoplasm of prostate: Secondary | ICD-10-CM | POA: Diagnosis not present

## 2024-01-26 DIAGNOSIS — Z0001 Encounter for general adult medical examination with abnormal findings: Secondary | ICD-10-CM

## 2024-01-26 DIAGNOSIS — R195 Other fecal abnormalities: Secondary | ICD-10-CM | POA: Diagnosis not present

## 2024-01-26 DIAGNOSIS — E7800 Pure hypercholesterolemia, unspecified: Secondary | ICD-10-CM

## 2024-01-26 DIAGNOSIS — R569 Unspecified convulsions: Secondary | ICD-10-CM | POA: Diagnosis not present

## 2024-01-26 DIAGNOSIS — N529 Male erectile dysfunction, unspecified: Secondary | ICD-10-CM

## 2024-01-26 DIAGNOSIS — N401 Enlarged prostate with lower urinary tract symptoms: Secondary | ICD-10-CM

## 2024-01-26 DIAGNOSIS — Z8669 Personal history of other diseases of the nervous system and sense organs: Secondary | ICD-10-CM

## 2024-01-26 DIAGNOSIS — I8311 Varicose veins of right lower extremity with inflammation: Secondary | ICD-10-CM | POA: Diagnosis not present

## 2024-01-26 DIAGNOSIS — F411 Generalized anxiety disorder: Secondary | ICD-10-CM | POA: Diagnosis not present

## 2024-01-26 DIAGNOSIS — E663 Overweight: Secondary | ICD-10-CM

## 2024-01-26 DIAGNOSIS — Z1329 Encounter for screening for other suspected endocrine disorder: Secondary | ICD-10-CM

## 2024-01-26 LAB — URINALYSIS, COMPLETE
Bilirubin, UA: NEGATIVE
Glucose, UA: NEGATIVE
Ketones, UA: NEGATIVE
Leukocytes,UA: NEGATIVE
Nitrite, UA: NEGATIVE
Protein,UA: NEGATIVE
RBC, UA: NEGATIVE
Specific Gravity, UA: 1.01 (ref 1.005–1.030)
Urobilinogen, Ur: 0.2 mg/dL (ref 0.2–1.0)
pH, UA: 7 (ref 5.0–7.5)

## 2024-01-26 LAB — MICROSCOPIC EXAMINATION
Bacteria, UA: NONE SEEN
RBC, Urine: NONE SEEN /HPF (ref 0–2)

## 2024-01-26 LAB — BLADDER SCAN AMB NON-IMAGING

## 2024-01-26 MED ORDER — TADALAFIL 5 MG PO TABS: 5.0000 mg | ORAL_TABLET | Freq: Every day | ORAL | 3 refills | Status: AC

## 2024-01-26 MED ORDER — DUTASTERIDE 0.5 MG PO CAPS: 0.5000 mg | ORAL_CAPSULE | Freq: Every day | ORAL | 3 refills | Status: AC

## 2024-01-26 MED ORDER — ROSUVASTATIN CALCIUM 10 MG PO TABS: 10.0000 mg | ORAL_TABLET | Freq: Every day | ORAL | 3 refills | Status: AC

## 2024-01-26 MED ORDER — TADALAFIL 10 MG PO TABS: 10.0000 mg | ORAL_TABLET | Freq: Every day | ORAL | 3 refills | Status: AC | PRN

## 2024-01-26 MED ORDER — BUSPIRONE HCL 10 MG PO TABS: 10.0000 mg | ORAL_TABLET | Freq: Two times a day (BID) | ORAL | 3 refills | Status: AC

## 2024-01-26 NOTE — Progress Notes (Signed)
 I, Maysun Jamey Mccallum, acting as a scribe for Geraline Knapp, MD., have documented all relevant documentation on the behalf of Geraline Knapp, MD, as directed by Geraline Knapp, MD while in the presence of Geraline Knapp, MD.  01/26/2024 5:43 PM   Benjamin Andrews 1966-08-15 [id number]  Referring provider: Kent Pear, MD No address on file  Chief Complaint  Patient presents with   Follow-up   Urologic history 1. BPH/chronic prostatitis Previously followed by Dr. Fredrick Jenkins and was on alfuzosin/dutasteride.  2. History elevated PSA Previous PSA 7.8 when seeing Dr. Fredrick Jenkins with prostate MRI showing a 55-gram gland without suspicious lesions. PSA subsequently returned to baseline.  HPI: Benjamin Andrews is a 58 y.o. male presents for annual follow-up.  At last year's visit, he discontinued his dutasteride and was started on tadalafil 5 mg daily.  Approximately 6 months after stopping the dutasteride, he has noted intermittent urinary symptoms of urgency and decreased force and caliber of his urinary stream. Denies dysuria, gross hematuria has noted some erectile dysfunction over the last 6 months.   PMH: Past Medical History:  Diagnosis Date   Allergic rhinitis    Anginal pain (HCC)    Blood in stool    Cervical radiculopathy at C6    Chickenpox    Generalized anxiety disorder    Headache    Hemorrhoids    High blood pressure    Seizures (HCC) 1998   diagnosed in 2000. last seizure 2016   Sleep apnea    UTI (lower urinary tract infection)     Surgical History: Past Surgical History:  Procedure Laterality Date   ANTERIOR CERVICAL DECOMP/DISCECTOMY FUSION N/A 06/13/2022   Procedure: C5-6 ANTERIOR CERVICAL DISCECTOMY AND FUSION (GLOBUS HEDRON);  Surgeon: Jodeen Munch, MD;  Location: [facility] ORS;  Service: Neurosurgery;  Laterality: N/A;   COLONOSCOPY WITH PROPOFOL N/A 12/04/2015   Procedure: COLONOSCOPY WITH PROPOFOL;  Surgeon: Cassie Click, MD;  Location: [facility]  ENDOSCOPY;  Service: Endoscopy;  Laterality: N/A;   COLONOSCOPY WITH PROPOFOL N/A 09/30/2022   Procedure: COLONOSCOPY WITH PROPOFOL;  Surgeon: Shane Darling, MD;  Location: [facility] ENDOSCOPY;  Service: Endoscopy;  Laterality: N/A;   RECONSTRUCTION OF NOSE     fracture   SHOULDER SURGERY Right 2000   bicep repair    Home Medications:  Allergies as of 01/26/2024   No Known Allergies      Medication List        Accurate as of January 26, 2024  5:43 PM. If you have any questions, ask your nurse or doctor.          STOP taking these medications    meloxicam 15 MG tablet Commonly known as: MOBIC Stopped by: Geraline Knapp       TAKE these medications    alfuzosin 10 MG 24 hr tablet Commonly known as: UROXATRAL TAKE 1 TABLET BY MOUTH DAILY  WITH BREAKFAST   BIOTIN PO Take 1 tablet by mouth daily.   busPIRone 10 MG tablet Commonly known as: BUSPAR Take 1 tablet (10 mg total) by mouth 2 (two) times daily.   COPPER PO Take 1 tablet by mouth daily.   dutasteride 0.5 MG capsule Commonly known as: AVODART Take 1 capsule (0.5 mg total) by mouth daily. Started by: Geraline Knapp   HYALURONIC ACID PO Take 1 tablet by mouth daily.   JOINT HEALTH PO Take 1 tablet by mouth daily.   MAGNESIUM PO Take 3 tablets by mouth  daily.   multivitamin tablet Take 1 tablet by mouth daily.   OMEGA-3 FATTY ACIDS PO Take 1 tablet by mouth daily.   Oxcarbazepine 300 MG tablet Commonly known as: TRILEPTAL Take 1 tablet (300 mg total) by mouth 2 (two) times daily.   rosuvastatin 10 MG tablet Commonly known as: CRESTOR Take 1 tablet (10 mg total) by mouth daily.   tadalafil 5 MG tablet Commonly known as: CIALIS Take 1 tablet (5 mg total) by mouth daily. What changed:  when to take this reasons to take this Changed by: Geraline Knapp   tadalafil 10 MG tablet Commonly known as: Cialis Take 1 tablet (10 mg total) by mouth daily as needed for erectile dysfunction. 1-2  tabs 1 hr prior to intercourse What changed: You were already taking a medication with the same name, and this prescription was added. Make sure you understand how and when to take each. Changed by: Geraline Knapp   vitamin C 100 MG tablet Take 100 mg by mouth daily.   VITAMIN D PO Take 1 tablet by mouth daily.   ZINC PO Take 1 tablet by mouth daily.        Allergies: No Known Allergies  Family History: Family History  Problem Relation Age of Onset   Alcoholism Other        Grandparent   Arthritis Other        Grandparent   Stroke Other        Parent   Diabetes Other        Grandparent   Rectal cancer Maternal Uncle    Cancer Mother        unsure - started in back   Healthy Father     Social History:  reports that he has never smoked. He has quit using smokeless tobacco.  His smokeless tobacco use included chew. He reports current alcohol use. He reports that he does not use drugs.   Physical Exam: BP (!) 151/96   Pulse 60   Ht 6\' 3"  (1.905 m)   Wt 240 lb (108.9 kg)   BMI 30.00 kg/m   Constitutional:  Alert and oriented, No acute distress. HEENT: [place] AT Respiratory: Normal respiratory effort, no increased work of breathing. Psychiatric: Normal mood and affect.   Urinalysis Dipstick/microscopy negative.   Assessment & Plan:    1. BPH/chronic prostatitis He has noted some worsening voiding symptoms 6 months after stopping dutasteride, which may reflect 30% prostate growth after stopping Dutasteride. He was interested in restarting and Rx sent to pharmacy.  Continue alfuzosin.  He desires to continue the daily tadalafil.   2. Erectile dysfunction Continue daily tadalafil; will add tadalafill 10mg  1-2 tablets 1 hour prior to intercourse.   3. Prostate cancer screening He states he has an appointment today for blood work for an annual physical, and he will request a PSA be drawn at that time.  I have reviewed the above documentation for accuracy and  completeness, and I agree with the above.   Geraline Knapp, MD  [facility] [address] [place], [place] [address] [phone]

## 2024-01-26 NOTE — Progress Notes (Signed)
 Benjamin Burkitt, NP-C Phone: [phone]  Benjamin Andrews is a 58 y.o. male who presents today for transfer of care.   Discussed the use of AI scribe software for clinical note transcription with the patient, who gave verbal consent to proceed.  History of Present Illness   Benjamin Andrews "Benjamin Andrews" is a 58 year old male who presents for transfer of care and annual exam.  He has a history of seizures, managed with Trileptal 300 mg twice daily. His seizures are well-controlled, and he follows up with neurology annually.  He has a history of elevated liver enzymes, previously attributed to Crestor and Tylenol use. Liver function tests normalized after reducing Crestor from 20 mg to 10 mg daily. He takes a liver detox supplement and experiences occasional right upper quadrant discomfort, described as an 'uncomfortable feeling' since his colonoscopy in December 2023.  He has hyperlipidemia, managed with Crestor 10 mg daily. He follows a diet similar to his diabetic wife's, likely low in carbohydrates and sugar, and eats mostly home-cooked meals including lean hamburger, chicken, and vegetables.  He has anxiety, managed with Buspar 10 mg twice daily. A previous trial of sertraline caused gastrointestinal upset. Buspar helps maintain mood stability, though he experiences 'attitude issues' if he misses a dose.  He has a history of sleep apnea and used a CPAP machine about 20 years ago, which he stopped after losing weight. He has since regained 20 pounds, and his wife reports occasional apneic episodes. He feels rested despite waking twice a night to urinate.  He had a colonoscopy in December 2023 and was told he would not need another for ten years. Since then, he has experienced changes in bowel habits, including 'ribbon-like' stools, and uses Miralax for constipation, which he has had since childhood.  He wears compression socks due to standing for long periods at work, leading to red blotches on his shins.  A dermatologist attributed this to varicose veins and prolonged standing. He has not experienced pain or swelling in his legs.  He does not smoke, drinks alcohol very rarely, and denies drug use. He exercises by golfing once a week and doing morning stretches and resistance training every other day.      Social History   Tobacco Use  Smoking Status Never  Smokeless Tobacco Former   Types: Chew    Current Outpatient Medications on File Prior to Visit  Medication Sig Dispense Refill   alfuzosin (UROXATRAL) 10 MG 24 hr tablet TAKE 1 TABLET BY MOUTH DAILY  WITH BREAKFAST 90 tablet 3   Ascorbic Acid (VITAMIN C) 100 MG tablet Take 100 mg by mouth daily.     BIOTIN PO Take 1 tablet by mouth daily.     COPPER PO Take 1 tablet by mouth daily.     dutasteride (AVODART) 0.5 MG capsule Take 1 capsule (0.5 mg total) by mouth daily. 90 capsule 3   MAGNESIUM PO Take 3 tablets by mouth daily.     Misc Natural Products (JOINT HEALTH PO) Take 1 tablet by mouth daily.     Multiple Vitamin (MULTIVITAMIN) tablet Take 1 tablet by mouth daily.     Multiple Vitamins-Minerals (ZINC PO) Take 1 tablet by mouth daily.     OMEGA-3 FATTY ACIDS PO Take 1 tablet by mouth daily.     Oxcarbazepine (TRILEPTAL) 300 MG tablet Take 1 tablet (300 mg total) by mouth 2 (two) times daily. 180 tablet 4   Sodium Hyaluronate, oral, (HYALURONIC ACID PO) Take 1 tablet by mouth  daily.     tadalafil (CIALIS) 10 MG tablet Take 1 tablet (10 mg total) by mouth daily as needed for erectile dysfunction. 1-2 tabs 1 hr prior to intercourse 30 tablet 3   tadalafil (CIALIS) 5 MG tablet Take 1 tablet (5 mg total) by mouth daily. 90 tablet 3   VITAMIN D PO Take 1 tablet by mouth daily.     No current facility-administered medications on file prior to visit.     ROS see history of present illness  Objective  Physical Exam Vitals:   01/26/24 1344  BP: 120/78  Pulse: 61  Temp: (!) 97.5 F (36.4 C)  SpO2: 98%    BP Readings from  Last 3 Encounters:  01/26/24 120/78  01/26/24 (!) 151/96  09/28/23 (!) 140/93   Wt Readings from Last 3 Encounters:  01/26/24 238 lb 6.4 oz (108.1 kg)  01/26/24 240 lb (108.9 kg)  03/03/23 234 lb 9.6 oz (106.4 kg)    Physical Exam Constitutional:      General: He is not in acute distress.    Appearance: Normal appearance.  HENT:     Head: Normocephalic.     Right Ear: Tympanic membrane normal.     Left Ear: Tympanic membrane normal.     Nose: Nose normal.     Mouth/Throat:     Mouth: Mucous membranes are moist.     Pharynx: Oropharynx is clear.  Eyes:     Conjunctiva/sclera: Conjunctivae normal.     Pupils: Pupils are equal, round, and reactive to light.  Neck:     Thyroid: No thyromegaly.  Cardiovascular:     Rate and Rhythm: Normal rate and regular rhythm.     Heart sounds: Normal heart sounds.  Pulmonary:     Effort: Pulmonary effort is normal.     Breath sounds: Normal breath sounds.  Abdominal:     General: Abdomen is flat. Bowel sounds are normal.     Palpations: Abdomen is soft. There is no mass.     Tenderness: There is no abdominal tenderness.  Musculoskeletal:        General: Normal range of motion.  Lymphadenopathy:     Cervical: No cervical adenopathy.  Skin:    General: Skin is warm and dry.     Findings: No rash.  Neurological:     General: No focal deficit present.     Mental Status: He is alert.  Psychiatric:        Mood and Affect: Mood normal.        Behavior: Behavior normal.     Assessment/Plan: Please see individual problem list.  Encounter for general adult medical examination with abnormal findings Assessment & Plan: Physical exam complete. We will order routine lab work as outlined and contact patient with results. Colonoscopy is up to date. PSA screening today in lab work. He is up to date on vaccinations, engages in regular physical activity, and follows a low-carb diet. Continue routine dental and eye exams. Continue healthy diet  and regular exercise. Return to care in one year, sooner as needed.    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 A previous dermatology consultation was normal. Discussed vascular referral for further evaluation. Refer to a vascular specialist for further evaluation.  Orders: -     CBC with Differential/Platelet -     Ambulatory referral to Vascular Surgery  History of obstructive sleep apnea Assessment &  Plan: He reports feeling rested but has gained weight and experiences apneic episodes. Discussed risks of untreated sleep apnea and offered a home sleep study. Refer to pulmonology for a home sleep study.  Orders: -     Pulmonary Visit  Change in stool caliber Assessment & Plan: He experiences intermittent right upper quadrant discomfort post-colonoscopy and has noticed changes in his stools. We will refer back to GI. Hx of fatty liver and elevated liver enzymes, check CMP today.   Orders: -     Ambulatory referral to Gastroenterology  Elevated LDL cholesterol level Assessment & Plan: His hyperlipidemia, previously associated with elevated liver enzymes, is managed with a reduced dose of Crestor, normalizing liver function tests. Refill Crestor 10 mg. Order cholesterol and liver function tests.  Orders: -     Rosuvastatin Calcium; Take 1 tablet (10 mg total) by mouth daily.  Dispense: 90 tablet; Refill: 3 -     Lipid panel -     Comprehensive metabolic panel with GFR  Seizures (HCC) Assessment & Plan: His seizure disorder is well-controlled on Trileptal with no recent seizures. Continue Trileptal 300 mg twice daily. Follow up with neurology annually.   Generalized anxiety disorder Assessment & Plan: His anxiety is well-controlled on Buspar, as Sertraline was not tolerated. Continue Buspar 10 mg twice daily.  Orders: -     busPIRone HCl; Take 1 tablet (10 mg total) by  mouth 2 (two) times daily.  Dispense: 180 tablet; Refill: 3  Overweight (BMI 25.0-29.9) -     Hemoglobin A1c  Screening PSA (prostate specific antigen) -     PSA  Thyroid disorder screen -     TSH    Return in about 1 year (around 01/25/2025) for Annual Exam, sooner as needed.   Benjamin Burkitt, NP-C [facility]

## 2024-01-27 LAB — CBC WITH DIFFERENTIAL/PLATELET
Basophils Absolute: 0.1 10*3/uL (ref 0.0–0.2)
Basos: 1 %
EOS (ABSOLUTE): 0.4 10*3/uL (ref 0.0–0.4)
Eos: 5 %
Hematocrit: 52.4 % — ABNORMAL HIGH (ref 37.5–51.0)
Hemoglobin: 16.8 g/dL (ref 13.0–17.7)
Immature Grans (Abs): 0 10*3/uL (ref 0.0–0.1)
Immature Granulocytes: 0 %
Lymphocytes Absolute: 2.7 10*3/uL (ref 0.7–3.1)
Lymphs: 38 %
MCH: 29.4 pg (ref 26.6–33.0)
MCHC: 32.1 g/dL (ref 31.5–35.7)
MCV: 92 fL (ref 79–97)
Monocytes Absolute: 0.5 10*3/uL (ref 0.1–0.9)
Monocytes: 7 %
Neutrophils Absolute: 3.5 10*3/uL (ref 1.4–7.0)
Neutrophils: 49 %
Platelets: 167 10*3/uL (ref 150–450)
RBC: 5.71 x10E6/uL (ref 4.14–5.80)
RDW: 12.6 % (ref 11.6–15.4)
WBC: 7.2 10*3/uL (ref 3.4–10.8)

## 2024-01-27 LAB — COMPREHENSIVE METABOLIC PANEL WITH GFR
ALT: 27 IU/L (ref 0–44)
AST: 29 IU/L (ref 0–40)
Albumin: 4.4 g/dL (ref 3.8–4.9)
Alkaline Phosphatase: 106 IU/L (ref 44–121)
BUN/Creatinine Ratio: 16 (ref 9–20)
BUN: 15 mg/dL (ref 6–24)
Bilirubin Total: 0.4 mg/dL (ref 0.0–1.2)
CO2: 24 mmol/L (ref 20–29)
Calcium: 9.7 mg/dL (ref 8.7–10.2)
Chloride: 101 mmol/L (ref 96–106)
Creatinine, Ser: 0.94 mg/dL (ref 0.76–1.27)
Globulin, Total: 2.7 g/dL (ref 1.5–4.5)
Glucose: 81 mg/dL (ref 70–99)
Potassium: 5 mmol/L (ref 3.5–5.2)
Sodium: 142 mmol/L (ref 134–144)
Total Protein: 7.1 g/dL (ref 6.0–8.5)
eGFR: 94 mL/min/{1.73_m2} (ref >59)

## 2024-01-27 LAB — LIPID PANEL
Chol/HDL Ratio: 6.3 ratio — ABNORMAL HIGH (ref 0.0–5.0)
Cholesterol, Total: 228 mg/dL — ABNORMAL HIGH (ref 100–199)
HDL: 36 mg/dL — ABNORMAL LOW (ref >39)
LDL Chol Calc (NIH): 170 mg/dL — ABNORMAL HIGH (ref 0–99)
Triglycerides: 122 mg/dL (ref 0–149)
VLDL Cholesterol Cal: 22 mg/dL (ref 5–40)

## 2024-01-27 LAB — HEMOGLOBIN A1C
Est. average glucose Bld gHb Est-mCnc: 111 mg/dL
Hgb A1c MFr Bld: 5.5 % (ref 4.8–5.6)

## 2024-01-27 LAB — PSA: Prostate Specific Ag, Serum: 2.3 ng/mL (ref 0.0–4.0)

## 2024-01-27 LAB — TSH: TSH: 1.22 u[IU]/mL (ref 0.450–4.500)

## 2024-01-31 ENCOUNTER — Encounter: Payer: Self-pay | Admitting: Nurse Practitioner

## 2024-02-01 ENCOUNTER — Encounter: Payer: Self-pay | Admitting: Nurse Practitioner

## 2024-02-01 ENCOUNTER — Other Ambulatory Visit: Payer: Self-pay | Admitting: Nurse Practitioner

## 2024-02-01 DIAGNOSIS — E7800 Pure hypercholesterolemia, unspecified: Secondary | ICD-10-CM

## 2024-02-01 DIAGNOSIS — R195 Other fecal abnormalities: Secondary | ICD-10-CM | POA: Insufficient documentation

## 2024-02-01 NOTE — Assessment & Plan Note (Signed)
 His hyperlipidemia, previously associated with elevated liver enzymes, is managed with a reduced dose of Crestor, normalizing liver function tests. Refill Crestor 10 mg. Order cholesterol and liver function tests.

## 2024-02-01 NOTE — Assessment & Plan Note (Signed)
 Physical exam complete. We will order routine lab work as outlined and contact patient with results. Colonoscopy is up to date. PSA screening today in lab work. He is up to date on vaccinations, engages in regular physical activity, and follows a low-carb diet. Continue routine dental and eye exams. Continue healthy diet and regular exercise. Return to care in one year, sooner as needed.

## 2024-02-01 NOTE — Assessment & Plan Note (Signed)
 He experiences intermittent right upper quadrant discomfort post-colonoscopy and has noticed changes in his stools. We will refer back to GI. Hx of fatty liver and elevated liver enzymes, check CMP today.

## 2024-02-01 NOTE — Assessment & Plan Note (Signed)
 He has no pain from varicose veins and wears compression socks. A previous dermatology consultation was normal. Discussed vascular referral for further evaluation. Refer to a vascular specialist for further evaluation.

## 2024-02-01 NOTE — Assessment & Plan Note (Signed)
 He reports feeling rested but has gained weight and experiences apneic episodes. Discussed risks of untreated sleep apnea and offered a home sleep study. Refer to pulmonology for a home sleep study.

## 2024-02-01 NOTE — Assessment & Plan Note (Signed)
 His seizure disorder is well-controlled on Trileptal with no recent seizures. Continue Trileptal 300 mg twice daily. Follow up with neurology annually.

## 2024-02-01 NOTE — Assessment & Plan Note (Signed)
 His anxiety is well-controlled on Buspar, as Sertraline was not tolerated. Continue Buspar 10 mg twice daily.

## 2024-02-09 ENCOUNTER — Ambulatory Visit: Admitting: Nurse Practitioner

## 2024-02-09 ENCOUNTER — Encounter: Payer: Self-pay | Admitting: Nurse Practitioner

## 2024-02-09 VITALS — BP 124/86 | HR 57 | Ht 75.0 in | Wt 236.6 lb

## 2024-02-09 DIAGNOSIS — R0683 Snoring: Secondary | ICD-10-CM

## 2024-02-09 DIAGNOSIS — G4733 Obstructive sleep apnea (adult) (pediatric): Secondary | ICD-10-CM | POA: Diagnosis not present

## 2024-02-09 DIAGNOSIS — R569 Unspecified convulsions: Secondary | ICD-10-CM

## 2024-02-09 DIAGNOSIS — F5101 Primary insomnia: Secondary | ICD-10-CM

## 2024-02-09 DIAGNOSIS — G4719 Other hypersomnia: Secondary | ICD-10-CM

## 2024-02-09 DIAGNOSIS — G4700 Insomnia, unspecified: Secondary | ICD-10-CM | POA: Insufficient documentation

## 2024-02-09 MED ORDER — ESZOPICLONE 1 MG PO TABS: 1.0000 mg | ORAL_TABLET | Freq: Every evening | ORAL | 1 refills | Status: DC | PRN

## 2024-02-09 NOTE — Assessment & Plan Note (Signed)
 Possibly due to untreated OSA. He uses diphenhydramine nightly in Advil PM and without this, is unable to sleep. He reports he had significant difficulties falling asleep during prior sleep studies in the lab, requiring multiple repeat studies. Advised him to discontinue use of diphenhydramine given long term negative effects. Will start him on low dose Lunesta. Reviewed safety/side effect profile. Understands that sedative medications can worsen sleep apnea when untreated. However, we need to address his ongoing sleep issues and hopefully, this will allow him to sleep during his upcoming sleep study. Will reassess use/need at follow up. Sleep hygiene reviewed.

## 2024-02-09 NOTE — Progress Notes (Signed)
 @Patient  ID: Benjamin Andrews, male    DOB: 1965/12/24, 58 y.o.   MRN: [id number]  Chief Complaint  Patient presents with   Consult    New sleep consults , pt stated that he hasn't had sleep study in 20 yrs anf not using a c-pap , he stated that he wife said that she feels like he stops breathing . Pt stated that get about 6 of sleep a night     Referring provider: Bluford Burkitt, NP  HPI: 58 year old male, never smoker referred for sleep consult. Past medical history significant for OSA, nocturnal seizures, ED, anxiety.   TEST/EVENTS:   02/09/2024: Today - sleep consult Discussed the use of AI scribe software for clinical note transcription with the patient, who gave verbal consent to proceed.  History of Present Illness   Abram Sax "[name]" is a 58 year old male with sleep apnea and nocturnal seizures who presents with concerns about untreated sleep apnea and daytime fatigue.  Approximately twenty years ago, he underwent a sleep study which led to the use of a CPAP machine for about four years. He discontinued its use due to discomfort with the nose pad and chin strap, especially as a side sleeper. Since stopping CPAP therapy, his wife has observed episodes of apnea during sleep, and he acknowledges snoring but does not wake up snoring or gasping.  Wakes up feeling okay but feels extremely exhausted about an hour after exercising in the mornings. While he does not report consistent daytime sleepiness, he notes an afternoon or early evening slump, attributing it to his work schedule. He denies issues with drowsy driving since changing his seizure medication, although he has experienced moments of inattention at stoplights before. No motor vehicle accidents. No morning headaches, sleep parasomnias/sleep paralysis.  He was diagnosed with nocturnal seizures in 1999 or 2000, prior to his initial sleep study. Initially, he was on a medication that caused significant fatigue, prompting a change in  medications. He currently takes Trileptal  300 mg twice a day, which he finds more manageable and controlling him well. Followed by neurology.   He has stopped consuming caffeinated coffee and switched to decaf about a year and a half ago. He takes Advil PM to help him sleep, as he wakes up every thirty minutes during the night without it. Rare alcohol intake. He reports no issues with attention span or drowsiness while operating heavy machinery at work. His weight remains stable compared to when he had his original sleep study.   Goes to bed around 8 pm. Falls asleep within 10 min. Wakes twice a night. Gets up around 2/230 am on work days. Does not have prior sleep study results.   Lives with his wife. Works as a Engineer, agricultural [employer]  No Known Allergies  Immunization History  Administered Date(s) Administered   Influenza,inj,Quad PF,6+ Mos 08/19/2018   Influenza-Unspecified 07/21/2020, 07/02/2021, 06/25/2022   PFIZER(Purple Top)SARS-COV-2 Vaccination 01/11/2020, 02/07/2020, 08/27/2020, 07/24/2021   Td 01/20/2023   Zoster Recombinant(Shingrix) 08/24/2022, 10/19/2022    Past Medical History:  Diagnosis Date   Allergic rhinitis    Anginal pain (HCC)    Blood in stool    Cervical radiculopathy at C6    Chickenpox    Generalized anxiety disorder    Headache    Hemorrhoids    High blood pressure    Seizures (HCC) 1998   diagnosed in 2000. last seizure 2016   Sleep apnea    UTI (lower  urinary tract infection)     Tobacco History: Social History   Tobacco Use  Smoking Status Never  Smokeless Tobacco Former   Types: Chew   Counseling given: Not Answered   Outpatient Medications Prior to Visit  Medication Sig Dispense Refill   alfuzosin  (UROXATRAL ) 10 MG 24 hr tablet TAKE 1 TABLET BY MOUTH DAILY  WITH BREAKFAST 90 tablet 3   Ascorbic Acid (VITAMIN C) 100 MG tablet Take 100 mg by mouth daily.     BIOTIN PO Take 1 tablet by mouth daily.     busPIRone  (BUSPAR ) 10 MG  tablet Take 1 tablet (10 mg total) by mouth 2 (two) times daily. 180 tablet 3   COPPER PO Take 1 tablet by mouth daily.     dutasteride  (AVODART ) 0.5 MG capsule Take 1 capsule (0.5 mg total) by mouth daily. 90 capsule 3   MAGNESIUM PO Take 3 tablets by mouth daily.     Misc Natural Products (JOINT HEALTH PO) Take 1 tablet by mouth daily.     Multiple Vitamin (MULTIVITAMIN) tablet Take 1 tablet by mouth daily.     Multiple Vitamins-Minerals (ZINC PO) Take 1 tablet by mouth daily.     OMEGA-3 FATTY ACIDS PO Take 1 tablet by mouth daily.     Oxcarbazepine  (TRILEPTAL ) 300 MG tablet Take 1 tablet (300 mg total) by mouth 2 (two) times daily. 180 tablet 4   rosuvastatin  (CRESTOR ) 10 MG tablet Take 1 tablet (10 mg total) by mouth daily. 90 tablet 3   Sodium Hyaluronate, oral, (HYALURONIC ACID PO) Take 1 tablet by mouth daily.     tadalafil  (CIALIS ) 10 MG tablet Take 1 tablet (10 mg total) by mouth daily as needed for erectile dysfunction. 1-2 tabs 1 hr prior to intercourse 30 tablet 3   tadalafil  (CIALIS ) 5 MG tablet Take 1 tablet (5 mg total) by mouth daily. 90 tablet 3   VITAMIN D PO Take 1 tablet by mouth daily.     No facility-administered medications prior to visit.     Review of Systems:   Constitutional: No weight loss or gain, night sweats, fevers, chills, or lassitude. +fatigue  HEENT: No headaches, difficulty swallowing, tooth/dental problems, or sore throat. No sneezing, itching, ear ache, nasal congestion, or post nasal drip CV:  No chest pain, orthopnea, PND Resp: +snoring, witnessed apneas. No shortness of breath with exertion or at rest.  GI:  No heartburn, indigestion GU: No nocturia  Skin: No rash, lesions, ulcerations MSK:  No joint pain or swelling.   Neuro: No memory impairment  Psych: No depression or anxiety. Mood stable. +sleep disturbance     Physical Exam:  BP 124/86   Pulse (!) 57   Ht 6\' 3"  (1.905 m)   Wt 236 lb 9.6 oz (107.3 kg)   SpO2 99%   BMI 29.57  kg/m   GEN: Pleasant, interactive, well-appearing; in no acute distress HEENT:  Normocephalic and atraumatic. PERRLA. Sclera white. Nasal turbinates pink, moist and patent bilaterally. No rhinorrhea present. Oropharynx pink and moist, without exudate or edema. No lesions, ulcerations, or postnasal drip. Mallampati II; tonsils +2/3 NECK:  Supple w/ fair ROM. Thyroid  symmetrical with no goiter or nodules palpated. No lymphadenopathy.   CV: RRR, no m/r/g, no peripheral edema. Pulses intact, +2 bilaterally. No cyanosis, pallor or clubbing. PULMONARY:  Unlabored, regular breathing. Clear bilaterally A&P w/o wheezes/rales/rhonchi.  GI: BS present and normoactive. Soft, non-tender to palpation.  MSK: No erythema, warmth or tenderness. Cap refil <2 sec  all extrem. Neuro: A/Ox3. No focal deficits noted.   Skin: Warm, no lesions or rashe Psych: Normal affect and behavior. Judgement and thought content appropriate.     Lab Results:  CBC    Component Value Date/Time   WBC 7.2 01/26/2024 1419   WBC 6.4 06/06/2022 0930   RBC 5.71 01/26/2024 1419   RBC 5.11 06/06/2022 0930   HGB 16.8 01/26/2024 1419   HCT 52.4 (H) 01/26/2024 1419   PLT 167 01/26/2024 1419   MCV 92 01/26/2024 1419   MCH 29.4 01/26/2024 1419   MCH 30.1 06/06/2022 0930   MCHC 32.1 01/26/2024 1419   MCHC 33.6 06/06/2022 0930   RDW 12.6 01/26/2024 1419   LYMPHSABS 2.7 01/26/2024 1419   EOSABS 0.4 01/26/2024 1419   BASOSABS 0.1 01/26/2024 1419    BMET    Component Value Date/Time   NA 142 01/26/2024 1419   K 5.0 01/26/2024 1419   CL 101 01/26/2024 1419   CO2 24 01/26/2024 1419   GLUCOSE 81 01/26/2024 1419   GLUCOSE 78 01/20/2023 1447   BUN 15 01/26/2024 1419   CREATININE 0.94 01/26/2024 1419   CREATININE 1.01 01/20/2023 1447   CALCIUM  9.7 01/26/2024 1419   GFRNONAA >60 06/06/2022 0930   GFRAA 108 10/01/2020 0807    BNP No results found for: "BNP"   Imaging:  No results found.  Administration History      None           No data to display          No results found for: "NITRICOXIDE"      Assessment & Plan:   OSA (obstructive sleep apnea) History of OSA, previously on CPAP.  Discontinued use around 15 years ago or more.  Had difficulties with mask and chinstrap.  Did not try alternative mask options. He has snoring, daytime sleepiness, nocturnal apneic events, restless sleep. BMI 29. History of nocturnal seizures, followed by neurology. Given this,  I am concerned he still has sleep disordered breathing with obstructive sleep apnea. With his seizure history, he will need an in lab sleep study for further evaluation.    - discussed how weight can impact sleep and risk for sleep disordered breathing - discussed options to assist with weight loss: combination of diet modification, cardiovascular and strength training exercises   - had an extensive discussion regarding the adverse health consequences related to untreated sleep disordered breathing - specifically discussed the risks for hypertension, coronary artery disease, cardiac dysrhythmias, cerebrovascular disease, and diabetes - lifestyle modification discussed   - discussed how sleep disruption can increase risk of accidents, particularly when driving - safe driving practices were discussed  Patient Instructions  Given your symptoms and history, I am concerned that you still have sleep disordered breathing with sleep apnea. You will need an in lab sleep study given your history of nocturnal seizures for further evaluation. Someone will contact you to schedule this.   We discussed how untreated sleep apnea puts an individual at risk for cardiac arrhthymias, pulm HTN, DM, stroke and increases their risk for daytime accidents. We also briefly reviewed treatment options including weight loss, side sleeping position, oral appliance, CPAP therapy or referral to ENT for possible surgical options  Use caution when driving and pull over  if you become sleepy.  Start lunesta 1 mg At bedtime as needed for sleep. Take immediately before bed. Ensure you have 7-8 hours in the bed after taking. Monitor for any mood changes or changes in  sleep habits. Stop and notify immediately if these occur. Do not drive or operate heavy machinery after taking. Do not take with alcohol or other sedating medications. May cause some morning grogginess or vivid dreams.   Follow up in 8 weeks with Katie Jakelin Taussig,NP to go over sleep study results, or sooner, if needed.     Insomnia Possibly due to untreated OSA. He uses diphenhydramine nightly in Advil PM and without this, is unable to sleep. He reports he had significant difficulties falling asleep during prior sleep studies in the lab, requiring multiple repeat studies. Advised him to discontinue use of diphenhydramine given long term negative effects. Will start him on low dose Lunesta. Reviewed safety/side effect profile. Understands that sedative medications can worsen sleep apnea when untreated. However, we need to address his ongoing sleep issues and hopefully, this will allow him to sleep during his upcoming sleep study. Will reassess use/need at follow up. Sleep hygiene reviewed.    Advised if symptoms do not improve or worsen, to please contact office for sooner follow up or seek emergency care.   I spent 45 minutes of dedicated to the care of this patient on the date of this encounter to include pre-visit review of records, face-to-face time with the patient discussing conditions above, post visit ordering of testing, clinical documentation with the electronic health record, making appropriate referrals as documented, and communicating necessary findings to members of the patients care team.  Roetta Clarke, NP 02/09/2024  Pt aware and understands NP's role.

## 2024-02-09 NOTE — Assessment & Plan Note (Signed)
 History of OSA, previously on CPAP.  Discontinued use around 15 years ago or more.  Had difficulties with mask and chinstrap.  Did not try alternative mask options. He has snoring, daytime sleepiness, nocturnal apneic events, restless sleep. BMI 29. History of nocturnal seizures, followed by neurology. Given this,  I am concerned he still has sleep disordered breathing with obstructive sleep apnea. With his seizure history, he will need an in lab sleep study for further evaluation.    - discussed how weight can impact sleep and risk for sleep disordered breathing - discussed options to assist with weight loss: combination of diet modification, cardiovascular and strength training exercises   - had an extensive discussion regarding the adverse health consequences related to untreated sleep disordered breathing - specifically discussed the risks for hypertension, coronary artery disease, cardiac dysrhythmias, cerebrovascular disease, and diabetes - lifestyle modification discussed   - discussed how sleep disruption can increase risk of accidents, particularly when driving - safe driving practices were discussed  Patient Instructions  Given your symptoms and history, I am concerned that you still have sleep disordered breathing with sleep apnea. You will need an in lab sleep study given your history of nocturnal seizures for further evaluation. Someone will contact you to schedule this.   We discussed how untreated sleep apnea puts an individual at risk for cardiac arrhthymias, pulm HTN, DM, stroke and increases their risk for daytime accidents. We also briefly reviewed treatment options including weight loss, side sleeping position, oral appliance, CPAP therapy or referral to ENT for possible surgical options  Use caution when driving and pull over if you become sleepy.  Start lunesta 1 mg At bedtime as needed for sleep. Take immediately before bed. Ensure you have 7-8 hours in the bed after taking.  Monitor for any mood changes or changes in sleep habits. Stop and notify immediately if these occur. Do not drive or operate heavy machinery after taking. Do not take with alcohol or other sedating medications. May cause some morning grogginess or vivid dreams.   Follow up in 8 weeks with Katie Eivin Mascio,NP to go over sleep study results, or sooner, if needed.

## 2024-02-09 NOTE — Patient Instructions (Addendum)
 Given your symptoms and history, I am concerned that you still have sleep disordered breathing with sleep apnea. You will need an in lab sleep study given your history of nocturnal seizures for further evaluation. Someone will contact you to schedule this.   We discussed how untreated sleep apnea puts an individual at risk for cardiac arrhthymias, pulm HTN, DM, stroke and increases their risk for daytime accidents. We also briefly reviewed treatment options including weight loss, side sleeping position, oral appliance, CPAP therapy or referral to ENT for possible surgical options  Use caution when driving and pull over if you become sleepy.  Start lunesta 1 mg At bedtime as needed for sleep. Take immediately before bed. Ensure you have 7-8 hours in the bed after taking. Monitor for any mood changes or changes in sleep habits. Stop and notify immediately if these occur. Do not drive or operate heavy machinery after taking. Do not take with alcohol or other sedating medications. May cause some morning grogginess or vivid dreams.   Follow up in 8 weeks with Benjamin Jayani Rozman,NP to go over sleep study results, or sooner, if needed.

## 2024-02-27 ENCOUNTER — Ambulatory Visit: Attending: Sleep Medicine

## 2024-02-27 DIAGNOSIS — Z6829 Body mass index (BMI) 29.0-29.9, adult: Secondary | ICD-10-CM | POA: Diagnosis not present

## 2024-02-27 DIAGNOSIS — E669 Obesity, unspecified: Secondary | ICD-10-CM | POA: Insufficient documentation

## 2024-02-27 DIAGNOSIS — G4710 Hypersomnia, unspecified: Secondary | ICD-10-CM | POA: Insufficient documentation

## 2024-02-27 DIAGNOSIS — I10 Essential (primary) hypertension: Secondary | ICD-10-CM | POA: Diagnosis not present

## 2024-02-27 DIAGNOSIS — R0683 Snoring: Secondary | ICD-10-CM | POA: Insufficient documentation

## 2024-02-27 DIAGNOSIS — G4733 Obstructive sleep apnea (adult) (pediatric): Secondary | ICD-10-CM | POA: Diagnosis not present

## 2024-02-27 DIAGNOSIS — R569 Unspecified convulsions: Secondary | ICD-10-CM

## 2024-02-27 DIAGNOSIS — G4719 Other hypersomnia: Secondary | ICD-10-CM

## 2024-03-05 ENCOUNTER — Encounter (INDEPENDENT_AMBULATORY_CARE_PROVIDER_SITE_OTHER): Payer: Self-pay

## 2024-03-07 DIAGNOSIS — G4733 Obstructive sleep apnea (adult) (pediatric): Secondary | ICD-10-CM

## 2024-03-08 ENCOUNTER — Ambulatory Visit (INDEPENDENT_AMBULATORY_CARE_PROVIDER_SITE_OTHER): Payer: Self-pay | Admitting: Nurse Practitioner

## 2024-03-08 NOTE — Progress Notes (Signed)
 Mild to moderate OSA. Can discuss at follow up. Thanks.

## 2024-03-15 ENCOUNTER — Other Ambulatory Visit (INDEPENDENT_AMBULATORY_CARE_PROVIDER_SITE_OTHER)

## 2024-03-15 DIAGNOSIS — E7800 Pure hypercholesterolemia, unspecified: Secondary | ICD-10-CM

## 2024-03-16 LAB — LIPID PANEL
Cholesterol: 133 mg/dL (ref <200)
HDL: 40 mg/dL (ref >=40)
LDL Cholesterol (Calc): 74 mg/dL
Non-HDL Cholesterol (Calc): 93 mg/dL (ref <130)
Total CHOL/HDL Ratio: 3.3 (calc) (ref <5.0)
Triglycerides: 106 mg/dL (ref <150)

## 2024-03-16 LAB — HEPATIC FUNCTION PANEL
AG Ratio: 2.1 (calc) (ref 1.0–2.5)
ALT: 26 U/L (ref 9–46)
AST: 26 U/L (ref 10–35)
Albumin: 4.6 g/dL (ref 3.6–5.1)
Alkaline phosphatase (APISO): 75 U/L (ref 35–144)
Bilirubin, Direct: 0.1 mg/dL (ref 0.0–0.2)
Globulin: 2.2 g/dL (ref 1.9–3.7)
Indirect Bilirubin: 0.5 mg/dL (ref 0.2–1.2)
Total Bilirubin: 0.6 mg/dL (ref 0.2–1.2)
Total Protein: 6.8 g/dL (ref 6.1–8.1)

## 2024-03-18 ENCOUNTER — Ambulatory Visit: Payer: Self-pay | Admitting: Nurse Practitioner

## 2024-03-19 ENCOUNTER — Other Ambulatory Visit (INDEPENDENT_AMBULATORY_CARE_PROVIDER_SITE_OTHER): Payer: Self-pay | Admitting: Nurse Practitioner

## 2024-03-19 DIAGNOSIS — I8311 Varicose veins of right lower extremity with inflammation: Secondary | ICD-10-CM

## 2024-03-22 ENCOUNTER — Encounter (INDEPENDENT_AMBULATORY_CARE_PROVIDER_SITE_OTHER): Payer: Self-pay | Admitting: Nurse Practitioner

## 2024-03-22 ENCOUNTER — Ambulatory Visit (INDEPENDENT_AMBULATORY_CARE_PROVIDER_SITE_OTHER): Admitting: Nurse Practitioner

## 2024-03-22 ENCOUNTER — Ambulatory Visit (INDEPENDENT_AMBULATORY_CARE_PROVIDER_SITE_OTHER)

## 2024-03-22 VITALS — BP 131/82 | HR 60 | Resp 18 | Ht 75.0 in | Wt 229.8 lb

## 2024-03-22 DIAGNOSIS — I8312 Varicose veins of left lower extremity with inflammation: Secondary | ICD-10-CM | POA: Diagnosis not present

## 2024-03-22 DIAGNOSIS — I8311 Varicose veins of right lower extremity with inflammation: Secondary | ICD-10-CM

## 2024-04-01 ENCOUNTER — Encounter (INDEPENDENT_AMBULATORY_CARE_PROVIDER_SITE_OTHER): Payer: Self-pay | Admitting: Nurse Practitioner

## 2024-04-01 NOTE — Progress Notes (Signed)
 Subjective:    Patient ID: Benjamin Andrews, male    DOB: 10-25-1965, 58 y.o.   MRN: [id number] Chief Complaint  Patient presents with   New Patient (Initial Visit)    Ref Julietta Ogren consult varicose veins with inflammation    The patient is seen for evaluation of symptomatic varicose veins. The patient relates burning and stinging which worsened steadily throughout the course of the day, particularly with standing. The patient also notes an aching and throbbing pain over the varicosities, particularly with prolonged dependent positions. The symptoms are significantly improved with elevation.  The patient also notes that during hot weather the symptoms are greatly intensified. The patient states the pain from the varicose veins interferes with work, daily exercise, shopping and household maintenance. At this point, the symptoms are persistent and severe enough that they're having a negative impact on lifestyle and are interfering with daily activities.  There is no history of DVT, PE or superficial thrombophlebitis. There is no history of ulceration or hemorrhage. The patient denies a significant family history of varicose veins.  The patient has worn graduated compression in the past. At the present time the patient has been using over-the-counter analgesics. There is no history of prior surgical intervention or sclerotherapy.  Noninvasive studies show no evidence of DVT or superficial thrombophlebitis bilaterally.  He does have deep venous insufficiency noted bilaterally.  However he also has significant superficial venous reflux noted bilaterally in the great saphenous veins.  There is also superficial venous reflux noted in the right small saphenous vein.    Review of Systems  All other systems reviewed and are negative.      Objective:   Physical Exam Vitals reviewed.   Cardiovascular:     Rate and Rhythm: Normal rate.     Pulses: Normal pulses.  Pulmonary:     Effort: Pulmonary  effort is normal.   Musculoskeletal:        General: Tenderness present.   Skin:    General: Skin is warm and dry.   Neurological:     Mental Status: He is alert and oriented to person, place, and time.   Psychiatric:        Mood and Affect: Mood normal.        Behavior: Behavior normal.        Thought Content: Thought content normal.        Judgment: Judgment normal.     BP 131/82   Pulse 60   Resp 18   Ht 6' 3 (1.905 m)   Wt 229 lb 12.8 oz (104.2 kg)   BMI 28.72 kg/m   Past Medical History:  Diagnosis Date   Allergic rhinitis    Anginal pain (HCC)    Blood in stool    Cervical radiculopathy at C6    Chickenpox    Generalized anxiety disorder    Headache    Hemorrhoids    High blood pressure    Seizures (HCC) 1998   diagnosed in 2000. last seizure 2016   Sleep apnea    UTI (lower urinary tract infection)     Social History   Socioeconomic History   Marital status: Married    Spouse name: Jinny Mounts   Number of children: 1   Years of education: some college   Highest education level: Associate degree: occupational, Scientist, product/process development, or vocational program  Occupational History   Occupation: Chartered certified accountant  Tobacco Use   Smoking status: Never   Smokeless tobacco: Former    Types:  Chew  Vaping Use   Vaping status: Never Used  Substance and Sexual Activity   Alcohol use: Yes    Comment: occassional (once a month)   Drug use: No   Sexual activity: Yes  Other Topics Concern   Not on file  Social History Narrative   Lives at home with his significant other.   Two cups caffeine per day.   Social Drivers of Corporate investment banker Strain: Low Risk  (02/09/2023)   Overall Financial Resource Strain (CARDIA)    Difficulty of Paying Living Expenses: Not very hard  Food Insecurity: No Food Insecurity (02/09/2023)   Hunger Vital Sign    Worried About Running Out of Food in the Last Year: Never true    Ran Out of Food in the Last Year: Never true   Transportation Needs: No Transportation Needs (02/09/2023)   PRAPARE - Administrator, Civil Service (Medical): No    Lack of Transportation (Non-Medical): No  Physical Activity: Insufficiently Active (02/09/2023)   Exercise Vital Sign    Days of Exercise per Week: 2 days    Minutes of Exercise per Session: 20 min  Stress: No Stress Concern Present (02/09/2023)   [employer] of Occupational Health - Occupational Stress Questionnaire    Feeling of Stress : Only a little  Social Connections: Unknown (02/09/2023)   Social Connection and Isolation Panel    Frequency of Communication with Friends and Family: Never    Frequency of Social Gatherings with Friends and Family: Never    Attends Religious Services: Patient declined    Database administrator or Organizations: No    Attends Engineer, structural: Not on file    Marital Status: Married  Catering manager Violence: Not on file    Past Surgical History:  Procedure Laterality Date   ANTERIOR CERVICAL DECOMP/DISCECTOMY FUSION N/A 06/13/2022   Procedure: C5-6 ANTERIOR CERVICAL DISCECTOMY AND FUSION (GLOBUS HEDRON);  Surgeon: Jodeen Munch, MD;  Location: [facility] ORS;  Service: Neurosurgery;  Laterality: N/A;   COLONOSCOPY WITH PROPOFOL  N/A 12/04/2015   Procedure: COLONOSCOPY WITH PROPOFOL ;  Surgeon: Cassie Click, MD;  Location: [facility] ENDOSCOPY;  Service: Endoscopy;  Laterality: N/A;   COLONOSCOPY WITH PROPOFOL  N/A 09/30/2022   Procedure: COLONOSCOPY WITH PROPOFOL ;  Surgeon: Shane Darling, MD;  Location: [facility] ENDOSCOPY;  Service: Endoscopy;  Laterality: N/A;   RECONSTRUCTION OF NOSE     fracture   SHOULDER SURGERY Right 2000   bicep repair    Family History  Problem Relation Age of Onset   Alcoholism Other        Grandparent   Arthritis Other        Grandparent   Stroke Other        Parent   Diabetes Other        Grandparent   Rectal cancer Maternal Uncle    Cancer Mother        unsure -  started in back   Healthy Father     No Known Allergies     Latest Ref Rng & Units 01/26/2024    2:19 PM 09/28/2023    8:11 AM 09/27/2022    8:11 AM  CBC  WBC 3.4 - 10.8 x10E3/uL 7.2  7.0  5.8   Hemoglobin 13.0 - 17.7 g/dL 16.1  09.6  04.5   Hematocrit 37.5 - 51.0 % 52.4  51.1  48.9   Platelets 150 - 450 x10E3/uL 167  160  154  CMP     Component Value Date/Time   NA 142 01/26/2024 1419   K 5.0 01/26/2024 1419   CL 101 01/26/2024 1419   CO2 24 01/26/2024 1419   GLUCOSE 81 01/26/2024 1419   GLUCOSE 78 01/20/2023 1447   BUN 15 01/26/2024 1419   CREATININE 0.94 01/26/2024 1419   CREATININE 1.01 01/20/2023 1447   CALCIUM  9.7 01/26/2024 1419   PROT 6.8 03/15/2024 1519   PROT 7.1 01/26/2024 1419   ALBUMIN 4.4 01/26/2024 1419   AST 26 03/15/2024 1519   ALT 26 03/15/2024 1519   ALKPHOS 106 01/26/2024 1419   BILITOT 0.6 03/15/2024 1519   BILITOT 0.4 01/26/2024 1419   GFR 90.65 10/06/2017 1506   EGFR 94 01/26/2024 1419   GFRNONAA >60 06/06/2022 0930     No results found.     Assessment & Plan:   1. 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 Patient will continue  wearing graduated compression stockings class 1 on a daily basis, beginning first thing in the morning and removing them in the evening.  The patient is CEAP [id number].  The patient has been wearing compression for more than 12 weeks with no or little benefit.  The patient has been exercising daily for more than 12 weeks. The patient has been elevating and taking OTC pain medications for more than 12 weeks.  None of these have have eliminated the pain related to the varicose veins and venous reflux or the discomfort regarding venous congestion.    In addition, behavioral modification including elevation during the day was again discussed and this will  continue.  The patient has utilized over the counter pain medications and has been exercising.  However, at this time conservative therapy has not alleviated the patient's symptoms of leg pain and swelling  Recommend: laser ablation of the right and  left great saphenous veins to eliminate the symptoms of pain and swelling of the lower extremities caused by the severe superficial venous reflux disease.    Current Outpatient Medications on File Prior to Visit  Medication Sig Dispense Refill   alfuzosin  (UROXATRAL ) 10 MG 24 hr tablet TAKE 1 TABLET BY MOUTH DAILY  WITH BREAKFAST 90 tablet 3   Ascorbic Acid (VITAMIN C) 100 MG tablet Take 100 mg by mouth daily.     BIOTIN PO Take 1 tablet by mouth daily.     busPIRone  (BUSPAR ) 10 MG tablet Take 1 tablet (10 mg total) by mouth 2 (two) times daily. 180 tablet 3   COPPER PO Take 1 tablet by mouth daily.     dutasteride  (AVODART ) 0.5 MG capsule Take 1 capsule (0.5 mg total) by mouth daily. 90 capsule 3   eszopiclone  (LUNESTA ) 1 MG TABS tablet Take 1 tablet (1 mg total) by mouth at bedtime as needed for sleep. Take immediately before bedtime 30 tablet 1   MAGNESIUM PO Take 3 tablets by mouth daily.     Misc Natural Products (JOINT HEALTH PO) Take 1 tablet by mouth daily.     Multiple Vitamin (MULTIVITAMIN) tablet Take 1 tablet by mouth daily.     Multiple Vitamins-Minerals (ZINC PO) Take 1 tablet by mouth daily.     OMEGA-3 FATTY ACIDS PO Take 1 tablet by mouth daily.     Oxcarbazepine  (TRILEPTAL ) 300 MG tablet Take 1 tablet (300 mg total) by mouth 2 (two) times  daily. 180 tablet 4   rosuvastatin  (CRESTOR ) 10 MG tablet Take 1 tablet (10 mg total) by mouth daily. 90 tablet 3   Sodium Hyaluronate, oral, (HYALURONIC ACID PO) Take 1 tablet by mouth daily.     tadalafil  (CIALIS ) 10 MG tablet Take 1 tablet (10 mg total) by mouth daily as needed for erectile dysfunction. 1-2 tabs 1 hr prior to intercourse 30 tablet 3   tadalafil  (CIALIS ) 5 MG tablet Take 1  tablet (5 mg total) by mouth daily. 90 tablet 3   VITAMIN D PO Take 1 tablet by mouth daily.     No current facility-administered medications on file prior to visit.    There are no Patient Instructions on file for this visit. No follow-ups on file.   Niamya Vittitow E Gillermo Poch, NP

## 2024-04-04 ENCOUNTER — Other Ambulatory Visit: Payer: Self-pay | Admitting: Nurse Practitioner

## 2024-04-04 DIAGNOSIS — F5101 Primary insomnia: Secondary | ICD-10-CM

## 2024-04-04 NOTE — Telephone Encounter (Signed)
 ATC X1. LMTCB

## 2024-04-04 NOTE — Telephone Encounter (Signed)
 Please reach out to pt and see how it is working for him and ensure no side effects. Thanks

## 2024-04-04 NOTE — Telephone Encounter (Signed)
 Katie, please advise controlled substance refill request for Lunesta . Last filled on 02-09-24 and lov on 02-09-24 by you.

## 2024-04-05 NOTE — Telephone Encounter (Signed)
 ATC X2. LMTCB. I sent a Mychart message informing pt and to please contact our office regarding this medication before anymore refills.

## 2024-04-08 NOTE — Telephone Encounter (Deleted)
**Note De-identified  [place] Obfuscation** Please advise 

## 2024-04-10 ENCOUNTER — Telehealth: Payer: Self-pay

## 2024-04-10 NOTE — Telephone Encounter (Signed)
 Copied from CRM [id number]. Topic: General - Call Back - No Documentation >> Apr 05, 2024  3:08 PM Benjamin Andrews wrote: Reason for CRM: Patient called returning Benjamin Andrews phone call, patient would like a callback after 3:30pm as he is at work. Patient expressed no other needs at this time. Patient was thankful and verbalized understanding.   nfn

## 2024-04-10 NOTE — Telephone Encounter (Signed)
 Katie,   Please advise refill request.

## 2024-04-10 NOTE — Telephone Encounter (Signed)
I sent yesterday.  

## 2024-04-24 DIAGNOSIS — R194 Change in bowel habit: Secondary | ICD-10-CM | POA: Diagnosis not present

## 2024-04-24 DIAGNOSIS — R1011 Right upper quadrant pain: Secondary | ICD-10-CM | POA: Diagnosis not present

## 2024-05-03 ENCOUNTER — Ambulatory Visit (INDEPENDENT_AMBULATORY_CARE_PROVIDER_SITE_OTHER): Admitting: Nurse Practitioner

## 2024-05-03 ENCOUNTER — Encounter: Payer: Self-pay | Admitting: Urology

## 2024-05-03 ENCOUNTER — Encounter: Payer: Self-pay | Admitting: Nurse Practitioner

## 2024-05-03 VITALS — BP 124/78 | HR 57 | Ht 75.0 in | Wt 234.0 lb

## 2024-05-03 DIAGNOSIS — F17220 Nicotine dependence, chewing tobacco, uncomplicated: Secondary | ICD-10-CM

## 2024-05-03 DIAGNOSIS — F5101 Primary insomnia: Secondary | ICD-10-CM

## 2024-05-03 DIAGNOSIS — G4700 Insomnia, unspecified: Secondary | ICD-10-CM

## 2024-05-03 DIAGNOSIS — G4733 Obstructive sleep apnea (adult) (pediatric): Secondary | ICD-10-CM

## 2024-05-03 MED ORDER — ESZOPICLONE 1 MG PO TABS: 1.0000 mg | ORAL_TABLET | Freq: Every evening | ORAL | 1 refills | Status: AC | PRN

## 2024-05-03 NOTE — Assessment & Plan Note (Signed)
 Very mild OSA with 4% AHI 5/h. Discussed minimal cardiovascular implications associated with mild OSA. Reviewed potential treatment options. He's opted for positional sleeping and healthy weight management. Will notify if he wishes to pursue OSA or oral appliance. Safe driving practices reviewed.  Patient Instructions  Continue lunesta  1 mg At bedtime as needed for sleep. Take immediately before bed. Ensure you have 7-8 hours in the bed after taking. Monitor for any mood changes or changes in sleep habits. Stop and notify immediately if these occur. Do not drive or operate heavy machinery after taking. Do not take with alcohol or other sedating medications. May cause some morning grogginess or vivid dreams.   I sent a 90 day refill to your mail in pharmacy. Let me know if they wont fill it due to it being controlled    Sleep study revealed mild sleep apnea. This poses minimal risks for future health problems. We also briefly reviewed treatment options including weight loss, side sleeping position, oral appliance, CPAP therapy or referral to ENT for possible surgical options. You've opted for side lying sleeping position   Follow up in 6 months with Katie Damir Leung,NP, or sooner, if needed.

## 2024-05-03 NOTE — Assessment & Plan Note (Addendum)
 Clinical benefit from low dose Lunesta . Tolerating well. No negative side effects. Reviewed safety/side effect profile. Understands that sedative medications can worsen sleep apnea when untreated. 90 day rx with 1 refill sent to [facility]. He will notify if they are unable to deliver it as it is a controlled substance. Follow up every 6 months for ongoing management/controlled substance use. Sleep hygiene reviewed.

## 2024-05-03 NOTE — Patient Instructions (Addendum)
 Continue lunesta  1 mg At bedtime as needed for sleep. Take immediately before bed. Ensure you have 7-8 hours in the bed after taking. Monitor for any mood changes or changes in sleep habits. Stop and notify immediately if these occur. Do not drive or operate heavy machinery after taking. Do not take with alcohol or other sedating medications. May cause some morning grogginess or vivid dreams.   I sent a 90 day refill to your mail in pharmacy. Let me know if they wont fill it due to it being controlled    Sleep study revealed mild sleep apnea. This poses minimal risks for future health problems. We also briefly reviewed treatment options including weight loss, side sleeping position, oral appliance, CPAP therapy or referral to ENT for possible surgical options. You've opted for side lying sleeping position   Follow up in 6 months with Katie Kay Shippy,NP, or sooner, if needed.

## 2024-05-03 NOTE — Progress Notes (Signed)
 @Patient  ID: Benjamin Andrews, male    DOB: 1966-04-25, 58 y.o.   MRN: [id number]  Chief Complaint  Patient presents with   Follow-up    He is having to use Lunesta  every night- tried without and did not sleep well and then tired during the day. He feels slightly groggy in the am, but this usually only last approx 30 min.     Referring provider: Gretel App, NP  HPI: 58 year old male, never smoker followed for insomnia and mild OSA. Past medical history significant for OSA, nocturnal seizures, ED, anxiety.   TEST/EVENTS:  02/27/2024 NPSG: AHI 5/h, SpO2 85%; RDI 20.9/h  02/09/2024: OV with Aaryn Sermon NP for sleep consult. Discussed the use of AI scribe software for clinical note transcription with the patient, who gave verbal consent to proceed. Benjamin Andrews is a 58 year old male with sleep apnea and nocturnal seizures who presents with concerns about untreated sleep apnea and daytime fatigue. Approximately twenty years ago, he underwent a sleep study which led to the use of a CPAP machine for about four years. He discontinued its use due to discomfort with the nose pad and chin strap, especially as a side sleeper. Since stopping CPAP therapy, his wife has observed episodes of apnea during sleep, and he acknowledges snoring but does not wake up snoring or gasping. Wakes up feeling okay but feels extremely exhausted about an hour after exercising in the mornings. While he does not report consistent daytime sleepiness, he notes an afternoon or early evening slump, attributing it to his work schedule. He denies issues with drowsy driving since changing his seizure medication, although he has experienced moments of inattention at stoplights before. No motor vehicle accidents. No morning headaches, sleep parasomnias/sleep paralysis. He was diagnosed with nocturnal seizures in 1999 or 2000, prior to his initial sleep study. Initially, he was on a medication that caused significant fatigue, prompting a  change in medications. He currently takes Trileptal  300 mg twice a day, which he finds more manageable and controlling him well. Followed by neurology.  He has stopped consuming caffeinated coffee and switched to decaf about a year and a half ago. He takes Advil PM to help him sleep, as he wakes up every thirty minutes during the night without it. Rare alcohol intake. He reports no issues with attention span or drowsiness while operating heavy machinery at work. His weight remains stable compared to when he had his original sleep study.  Goes to bed around 8 pm. Falls asleep within 10 min. Wakes twice a night. Gets up around 2/230 am on work days. Does not have prior sleep study results.  Lives with his wife. Works as a Paramedic 8  05/03/2024: Today - follow up Discussed the use of AI scribe software for clinical note transcription with the patient, who gave verbal consent to proceed.  History of Present Illness Benjamin Andrews is a 58 year old male who presents for follow-up regarding his sleep study results and management of insomnia.  He recently underwent a sleep study which revealed very mild OSA based on 4% AHI.   He has been using Lunesta , which he finds effective in improving his sleep quality and energy levels. When he does not take Lunesta , he feels more tired the next day. He is interested in obtaining a 90-day supply of Lunesta  to minimize costs and is exploring mail order options for his medications. He denies any aberrant behavior, morning hangover, drowsy driving, headaches, changes  in mood.     No Known Allergies  Immunization History  Administered Date(s) Administered   Influenza,inj,Quad PF,6+ Mos 08/19/2018   Influenza-Unspecified 07/21/2020, 07/02/2021, 06/25/2022   PFIZER(Purple Top)SARS-COV-2 Vaccination 01/11/2020, 02/07/2020, 08/27/2020, 07/24/2021   Td 01/20/2023   Zoster Recombinant(Shingrix) 08/24/2022, 10/19/2022    Past Medical History:   Diagnosis Date   Allergic rhinitis    Anginal pain (HCC)    Blood in stool    Cervical radiculopathy at C6    Chickenpox    Generalized anxiety disorder    Headache    Hemorrhoids    High blood pressure    Seizures (HCC) 1998   diagnosed in 2000. last seizure 2016   Sleep apnea    UTI (lower urinary tract infection)     Tobacco History: Social History   Tobacco Use  Smoking Status Never  Smokeless Tobacco Former   Types: Chew   Counseling given: Not Answered   Outpatient Medications Prior to Visit  Medication Sig Dispense Refill   alfuzosin  (UROXATRAL ) 10 MG 24 hr tablet TAKE 1 TABLET BY MOUTH DAILY  WITH BREAKFAST 90 tablet 3   Ascorbic Acid (VITAMIN C) 100 MG tablet Take 100 mg by mouth daily.     BIOTIN PO Take 1 tablet by mouth daily.     busPIRone  (BUSPAR ) 10 MG tablet Take 1 tablet (10 mg total) by mouth 2 (two) times daily. 180 tablet 3   COPPER PO Take 1 tablet by mouth daily.     dutasteride  (AVODART ) 0.5 MG capsule Take 1 capsule (0.5 mg total) by mouth daily. 90 capsule 3   MAGNESIUM PO Take 3 tablets by mouth daily.     Misc Natural Products (JOINT HEALTH PO) Take 1 tablet by mouth daily.     Multiple Vitamin (MULTIVITAMIN) tablet Take 1 tablet by mouth daily.     Multiple Vitamins-Minerals (ZINC PO) Take 1 tablet by mouth daily.     OMEGA-3 FATTY ACIDS PO Take 1 tablet by mouth daily.     Oxcarbazepine  (TRILEPTAL ) 300 MG tablet Take 1 tablet (300 mg total) by mouth 2 (two) times daily. 180 tablet 4   rosuvastatin  (CRESTOR ) 10 MG tablet Take 1 tablet (10 mg total) by mouth daily. 90 tablet 3   Sodium Hyaluronate, oral, (HYALURONIC ACID PO) Take 1 tablet by mouth daily.     tadalafil  (CIALIS ) 10 MG tablet Take 1 tablet (10 mg total) by mouth daily as needed for erectile dysfunction. 1-2 tabs 1 hr prior to intercourse 30 tablet 3   tadalafil  (CIALIS ) 5 MG tablet Take 1 tablet (5 mg total) by mouth daily. 90 tablet 3   VITAMIN D PO Take 1 tablet by mouth daily.      eszopiclone  (LUNESTA ) 1 MG TABS tablet TAKE 1 TABLET BY MOUTH AT BEDTIME AS NEEDED FOR SLEEP (TAKE  IMMEDIATELY  BEFORE  BEDTIME) 30 tablet 0   No facility-administered medications prior to visit.     Review of Systems:   Constitutional: No weight loss or gain, night sweats, fevers, chills, or lassitude. +fatigue (improved) HEENT: No headaches, difficulty swallowing, tooth/dental problems, or sore throat. No sneezing, itching, ear ache, nasal congestion, or post nasal drip CV:  No chest pain, orthopnea, PND Resp: +snoring, witnessed apneas. No shortness of breath with exertion or at rest.  GI:  No heartburn, indigestion GU: No nocturia  Skin: No rash, lesions, ulcerations MSK:  No joint pain or swelling.   Neuro: No memory impairment  Psych: No depression or  anxiety. Mood stable. +sleep disturbance (improved)    Physical Exam:  BP 124/78 (BP Location: Right Arm, Cuff Size: Normal)   Pulse (!) 57   Ht 6' 3 (1.905 m)   Wt 234 lb (106.1 kg)   SpO2 97%   BMI 29.25 kg/m   GEN: Pleasant, interactive, well-appearing; in no acute distress HEENT:  Normocephalic and atraumatic. PERRLA. Sclera white. Nasal turbinates pink, moist and patent bilaterally. No rhinorrhea present. Oropharynx pink and moist, without exudate or edema. No lesions, ulcerations, or postnasal drip. Mallampati II; tonsils +2/3 NECK:  Supple w/ fair ROM. Thyroid  symmetrical with no goiter or nodules palpated. No lymphadenopathy.   CV: RRR, no m/r/g, no peripheral edema. Pulses intact, +2 bilaterally. No cyanosis, pallor or clubbing. PULMONARY:  Unlabored, regular breathing. Clear bilaterally A&P w/o wheezes/rales/rhonchi.  GI: BS present and normoactive. Soft, non-tender to palpation.  MSK: No erythema, warmth or tenderness. Cap refil <2 sec all extrem. Neuro: A/Ox3. No focal deficits noted.   Skin: Warm, no lesions or rashe Psych: Normal affect and behavior. Judgement and thought content appropriate.      Lab Results:  CBC    Component Value Date/Time   WBC 7.2 01/26/2024 1419   WBC 6.4 06/06/2022 0930   RBC 5.71 01/26/2024 1419   RBC 5.11 06/06/2022 0930   HGB 16.8 01/26/2024 1419   HCT 52.4 (H) 01/26/2024 1419   PLT 167 01/26/2024 1419   MCV 92 01/26/2024 1419   MCH 29.4 01/26/2024 1419   MCH 30.1 06/06/2022 0930   MCHC 32.1 01/26/2024 1419   MCHC 33.6 06/06/2022 0930   RDW 12.6 01/26/2024 1419   LYMPHSABS 2.7 01/26/2024 1419   EOSABS 0.4 01/26/2024 1419   BASOSABS 0.1 01/26/2024 1419    BMET    Component Value Date/Time   NA 142 01/26/2024 1419   K 5.0 01/26/2024 1419   CL 101 01/26/2024 1419   CO2 24 01/26/2024 1419   GLUCOSE 81 01/26/2024 1419   GLUCOSE 78 01/20/2023 1447   BUN 15 01/26/2024 1419   CREATININE 0.94 01/26/2024 1419   CREATININE 1.01 01/20/2023 1447   CALCIUM  9.7 01/26/2024 1419   GFRNONAA >60 06/06/2022 0930   GFRAA 108 10/01/2020 0807    BNP No results found for: BNP   Imaging:  No results found.  Administration History     None           No data to display          No results found for: NITRICOXIDE      Assessment & Plan:   OSA (obstructive sleep apnea) Very mild OSA with 4% AHI 5/h. Discussed minimal cardiovascular implications associated with mild OSA. Reviewed potential treatment options. He's opted for positional sleeping and healthy weight management. Will notify if he wishes to pursue OSA or oral appliance. Safe driving practices reviewed.  Patient Instructions  Continue lunesta  1 mg At bedtime as needed for sleep. Take immediately before bed. Ensure you have 7-8 hours in the bed after taking. Monitor for any mood changes or changes in sleep habits. Stop and notify immediately if these occur. Do not drive or operate heavy machinery after taking. Do not take with alcohol or other sedating medications. May cause some morning grogginess or vivid dreams.   I sent a 90 day refill to your mail in pharmacy.  Let me know if they wont fill it due to it being controlled    Sleep study revealed mild sleep apnea. This poses minimal risks  for future health problems. We also briefly reviewed treatment options including weight loss, side sleeping position, oral appliance, CPAP therapy or referral to ENT for possible surgical options. You've opted for side lying sleeping position   Follow up in 6 months with Katie Anmol Paschen,NP, or sooner, if needed.    Insomnia Clinical benefit from low dose Lunesta . Tolerating well. No negative side effects. Reviewed safety/side effect profile. Understands that sedative medications can worsen sleep apnea when untreated. 90 day rx with 1 refill sent to [facility]. He will notify if they are unable to deliver it as it is a controlled substance. Follow up every 6 months for ongoing management/controlled substance use. Sleep hygiene reviewed.     Advised if symptoms do not improve or worsen, to please contact office for sooner follow up or seek emergency care.   I spent 35 minutes of dedicated to the care of this patient on the date of this encounter to include pre-visit review of records, face-to-face time with the patient discussing conditions above, post visit ordering of testing, clinical documentation with the electronic health record, making appropriate referrals as documented, and communicating necessary findings to members of the patients care team.  Comer LULLA Rouleau, NP 05/03/2024  Pt aware and understands NP's role.

## 2024-05-07 ENCOUNTER — Telehealth: Payer: Self-pay

## 2024-05-07 NOTE — Telephone Encounter (Signed)
 Received fax from [facility] stating Lunesta  has been denied for the requested script. They will cover #45/75 days without a PA.  Benjamin Andrews is requesting PA be started for script of 1 TAB QHS PRN, #30/30 days. Thank you!

## 2024-05-08 ENCOUNTER — Other Ambulatory Visit (HOSPITAL_COMMUNITY): Payer: Self-pay

## 2024-05-08 ENCOUNTER — Telehealth: Payer: Self-pay

## 2024-05-08 NOTE — Telephone Encounter (Signed)
.  PA request has been Received. New Encounter has been or will be created for follow up. For additional info see Pharmacy Prior Auth telephone encounter from 07/23.

## 2024-05-08 NOTE — Telephone Encounter (Signed)
*  Pulm  Pharmacy Patient Advocate Encounter   Received notification from Pt Calls Messages that prior authorization for Eszopiclone  1MG  tablets  is required/requested.   Insurance verification completed.   The patient is insured through [facility] .   Per test claim: PA required; PA submitted to above mentioned insurance via CoverMyMeds Key/confirmation #/EOC [id number] Status is pending

## 2024-05-08 NOTE — Telephone Encounter (Signed)
 Pharmacy Patient Advocate Encounter  Received notification from [facility] that Prior Authorization for Eszopiclone  1MG  tablets  has been APPROVED from 05/08/2024 to 05/08/2025. Ran test claim, Copay is $5.00. This test claim was processed through [facility]- copay amounts may vary at other pharmacies due to pharmacy/plan contracts, or as the patient moves through the different stages of their insurance plan.

## 2024-05-24 ENCOUNTER — Other Ambulatory Visit (INDEPENDENT_AMBULATORY_CARE_PROVIDER_SITE_OTHER): Payer: Self-pay

## 2024-05-24 ENCOUNTER — Telehealth (INDEPENDENT_AMBULATORY_CARE_PROVIDER_SITE_OTHER): Payer: Self-pay | Admitting: Vascular Surgery

## 2024-05-24 NOTE — Telephone Encounter (Signed)
 Pt is scheduled for bilateral GSV laser ablation with Dr. Marea on : right leg: 07/05/24 and left leg: 08/02/24. Patient will need standard RX protocol called in to [facility] on [address] in [place]. Thanks!

## 2024-05-24 NOTE — Telephone Encounter (Signed)
 sent

## 2024-05-27 MED ORDER — ALPRAZOLAM 0.5 MG PO TABS: ORAL_TABLET | ORAL | 0 refills | Status: DC

## 2024-07-05 ENCOUNTER — Other Ambulatory Visit (INDEPENDENT_AMBULATORY_CARE_PROVIDER_SITE_OTHER): Admitting: Vascular Surgery

## 2024-07-12 ENCOUNTER — Encounter (INDEPENDENT_AMBULATORY_CARE_PROVIDER_SITE_OTHER)

## 2024-07-22 ENCOUNTER — Encounter: Payer: Self-pay | Admitting: Nurse Practitioner

## 2024-07-24 ENCOUNTER — Other Ambulatory Visit: Payer: Self-pay

## 2024-07-24 ENCOUNTER — Emergency Department
Admission: EM | Admit: 2024-07-24 | Discharge: 2024-07-25 | Disposition: A | Discharge status: Home / Self Care | Attending: Emergency Medicine | Admitting: Emergency Medicine

## 2024-07-24 ENCOUNTER — Emergency Department

## 2024-07-24 DIAGNOSIS — M25562 Pain in left knee: Secondary | ICD-10-CM | POA: Insufficient documentation

## 2024-07-24 LAB — CBC WITH DIFFERENTIAL/PLATELET
Abs Immature Granulocytes: 0.02 K/uL (ref 0.00–0.07)
Basophils Absolute: 0.1 K/uL (ref 0.0–0.1)
Basophils Relative: 1 %
Eosinophils Absolute: 0.4 K/uL (ref 0.0–0.5)
Eosinophils Relative: 6 %
HCT: 45.3 % (ref 39.0–52.0)
Hemoglobin: 15.3 g/dL (ref 13.0–17.0)
Immature Granulocytes: 0 %
Lymphocytes Relative: 29 %
Lymphs Abs: 2.3 K/uL (ref 0.7–4.0)
MCH: 30 pg (ref 26.0–34.0)
MCHC: 33.8 g/dL (ref 30.0–36.0)
MCV: 88.8 fL (ref 80.0–100.0)
Monocytes Absolute: 0.6 K/uL (ref 0.1–1.0)
Monocytes Relative: 7 %
Neutro Abs: 4.6 K/uL (ref 1.7–7.7)
Neutrophils Relative %: 57 %
Platelets: 146 K/uL — ABNORMAL LOW (ref 150–400)
RBC: 5.1 MIL/uL (ref 4.22–5.81)
RDW: 12.7 % (ref 11.5–15.5)
WBC: 8 K/uL (ref 4.0–10.5)
nRBC: 0 % (ref 0.0–0.2)

## 2024-07-24 LAB — BASIC METABOLIC PANEL WITH GFR
Anion gap: 9 (ref 5–15)
BUN: 24 mg/dL — ABNORMAL HIGH (ref 6–20)
CO2: 27 mmol/L (ref 22–32)
Calcium: 9.3 mg/dL (ref 8.9–10.3)
Chloride: 102 mmol/L (ref 98–111)
Creatinine, Ser: 1.05 mg/dL (ref 0.61–1.24)
GFR, Estimated: >60 mL/min (ref >60)
Glucose, Bld: 103 mg/dL — ABNORMAL HIGH (ref 70–99)
Potassium: 3.9 mmol/L (ref 3.5–5.1)
Sodium: 138 mmol/L (ref 135–145)

## 2024-07-24 NOTE — ED Triage Notes (Signed)
 Pt reports pain behind left knee, pt denies redness or swelling. Pt is not on blood thinners. Pt reports today when he was walking his left leg gave out.

## 2024-07-25 ENCOUNTER — Emergency Department

## 2024-07-25 DIAGNOSIS — M25562 Pain in left knee: Secondary | ICD-10-CM | POA: Diagnosis not present

## 2024-07-25 MED ORDER — IBUPROFEN 600 MG PO TABS
600.0000 mg | ORAL_TABLET | Freq: Once | ORAL | Status: AC
  Administered 2024-07-25: 600 mg via ORAL
  Filled 2024-07-25: qty 1

## 2024-07-25 MED ORDER — ACETAMINOPHEN 325 MG PO TABS
650.0000 mg | ORAL_TABLET | Freq: Once | ORAL | Status: AC
  Administered 2024-07-25: 650 mg via ORAL
  Filled 2024-07-25: qty 2

## 2024-07-25 NOTE — ED Provider Notes (Incomplete)
 [facility] Provider Note    Event Date/Time   First MD Initiated Contact with Patient 07/24/24 2345     (approximate)   History   Leg Pain   HPI  Benjamin Andrews is a 58 y.o. male   Past medical history of ***    Independent Historian contributed to assessment above: ***  External Medical Documents Reviewed: ***      Physical Exam   Triage Vital Signs: ED Triage Vitals  Encounter Vitals Group     BP 07/24/24 1940 (!) 153/90     Girls Systolic BP Percentile --      Girls Diastolic BP Percentile --      Boys Systolic BP Percentile --      Boys Diastolic BP Percentile --      Pulse Rate 07/24/24 1940 (!) 58     Resp 07/24/24 1940 18     Temp 07/24/24 1940 98.4 F (36.9 C)     Temp Source 07/24/24 1940 Oral     SpO2 07/24/24 1940 99 %     Weight 07/24/24 1939 228 lb (103.4 kg)     Height 07/24/24 1939 6' 4 (1.93 m)     Head Circumference --      Peak Flow --      Pain Score 07/24/24 1939 0     Pain Loc --      Pain Education --      Exclude from Growth Chart --     Most recent vital signs: Vitals:   07/24/24 1940  BP: (!) 153/90  Pulse: (!) 58  Resp: 18  Temp: 98.4 F (36.9 C)  SpO2: 99%    General: Awake, no distress. *** CV:  Good peripheral perfusion. *** Resp:  Normal effort. *** Abd:  No distention. *** Other:  ***   ED Results / Procedures / Treatments   Labs (all labs ordered are listed, but only abnormal results are displayed) Labs Reviewed  CBC WITH DIFFERENTIAL/PLATELET - Abnormal; Notable for the following components:      Result Value   Platelets 146 (*)    All other components within normal limits  BASIC METABOLIC PANEL WITH GFR - Abnormal; Notable for the following components:   Glucose, Bld 103 (*)    BUN 24 (*)    All other components within normal limits     I ordered and reviewed the above labs they are notable for ***  EKG  ED ECG REPORT I, Ginnie Shams, the attending physician, personally  viewed and interpreted this ECG.   Date: 07/25/2024  EKG Time: ***  Rate: ***  Rhythm: {ekg findings:315101}  Axis: ***  Intervals:{conduction defects:17367}  ST&T Change: ***    RADIOLOGY I independently reviewed and interpreted *** I also reviewed radiologist's formal read.   PROCEDURES:  Critical Care performed: {CriticalCareYesNo:19197::Yes, see critical care procedure note(s),No}  Procedures   MEDICATIONS ORDERED IN ED: Medications - No data to display  External physician / consultants:  I spoke with *** regarding care plan for this patient.   IMPRESSION / MDM / ASSESSMENT AND PLAN / ED COURSE  I reviewed the triage vital signs and the nursing notes.                                Patient's presentation is most consistent with {EM COPA:27473}  Differential diagnosis includes, but is not limited to, ***   ***The patient  is on the cardiac monitor to evaluate for evidence of arrhythmia and/or significant heart rate changes.  MDM:  ***  I considered hospitalization for admission or observation ***        FINAL CLINICAL IMPRESSION(S) / ED DIAGNOSES   Final diagnoses:  None     Rx / DC Orders   ED Discharge Orders     None        Note:  This document was prepared using Dragon voice recognition software and may include unintentional dictation errors.

## 2024-07-25 NOTE — ED Provider Notes (Signed)
 [facility] Provider Note    Event Date/Time   First MD Initiated Contact with Patient 07/24/24 2345     (approximate)   History   Leg Pain   HPI  Benjamin Andrews is a 58 y.o. male   Past medical history of high blood pressure presents emergency department with knee injury.  For sustained while playing golf a couple weeks ago during his swing he injured his left side of his knee/behind the knee.  It was sore for several days and then improved.  He got down from a truck today and stepped down onto the affected lower extremity and reexacerbated the injury.  Pain is behind the knee/to the left side of the knee.  Has progressively worsened.  Denies any other injury or acute medical pain.  No swelling erythema or warmth.  No fever.  Independent Historian contributed to assessment above: Wife corroborates information above  External Medical Documents Reviewed: Prior outpatient notes      Physical Exam   Triage Vital Signs: ED Triage Vitals  Encounter Vitals Group     BP 07/24/24 1940 (!) 153/90     Girls Systolic BP Percentile --      Girls Diastolic BP Percentile --      Boys Systolic BP Percentile --      Boys Diastolic BP Percentile --      Pulse Rate 07/24/24 1940 (!) 58     Resp 07/24/24 1940 18     Temp 07/24/24 1940 98.4 F (36.9 C)     Temp Source 07/24/24 1940 Oral     SpO2 07/24/24 1940 99 %     Weight 07/24/24 1939 228 lb (103.4 kg)     Height 07/24/24 1939 6' 4 (1.93 m)     Head Circumference --      Peak Flow --      Pain Score 07/24/24 1939 0     Pain Loc --      Pain Education --      Exclude from Growth Chart --     Most recent vital signs: Vitals:   07/24/24 1940  BP: (!) 153/90  Pulse: (!) 58  Resp: 18  Temp: 98.4 F (36.9 C)  SpO2: 99%    General: Awake, no distress.  CV:  Good peripheral perfusion.  Resp:  Normal effort.  Abd:  No distention. Other:  Point tenderness to lateral side of left knee.  No  significant swelling warmth or erythema.  Able to range to 90 degrees flexion.  No significant skin changes.  Pulses intact and strong distally.  Ranging at the hip appropriately.  With varus stress, pain to the lateral side of the knee is exacerbated.   ED Results / Procedures / Treatments   Labs (all labs ordered are listed, but only abnormal results are displayed) Labs Reviewed  CBC WITH DIFFERENTIAL/PLATELET - Abnormal; Notable for the following components:      Result Value   Platelets 146 (*)    All other components within normal limits  BASIC METABOLIC PANEL WITH GFR - Abnormal; Notable for the following components:   Glucose, Bld 103 (*)    BUN 24 (*)    All other components within normal limits     I ordered and reviewed the above labs they are notable for cell counts electrolytes unremarkable.    RADIOLOGY I independently reviewed and interpreted x-ray of the knee and see no obvious fracture I also reviewed radiologist's formal read.  PROCEDURES:  Critical Care performed: No  Procedures   MEDICATIONS ORDERED IN ED: Medications  acetaminophen  (TYLENOL ) tablet 650 mg (has no administration in time range)  ibuprofen (ADVIL) tablet 600 mg (has no administration in time range)     IMPRESSION / MDM / ASSESSMENT AND PLAN / ED COURSE  I reviewed the triage vital signs and the nursing notes.                                Patient's presentation is most consistent with acute presentation with potential threat to life or bodily function.  Differential diagnosis includes, but is not limited to, ligamentous strain, Baker's cyst rupture, DVT, x-rays or dislocations, considered but less likely septic joint, ischemic limb, vascular emergency   The patient is on the cardiac monitor to evaluate for evidence of arrhythmia and/or significant heart rate changes.  MDM:    Most likely a strain of the lateral ligaments of the knee as there is point tenderness there pain  with varus stressing.  Correlates with the type of injury as he was just finishing a golf swing, right handed, and exacerbated today after stepping off of a truck.  He is having pain with walking and is limiting his ability to walk so I will put him in a knee immobilizer and crutches for comfort as needed and have him follow-up with orthopedist.  No evidence of DVT on ultrasound, low clinical suspicion of ischemic limb, septic joint.  Appropriate for discharge and outpatient follow-up.      FINAL CLINICAL IMPRESSION(S) / ED DIAGNOSES   Final diagnoses:  Left lateral knee pain     Rx / DC Orders   ED Discharge Orders     None        Note:  This document was prepared using Dragon voice recognition software and may include unintentional dictation errors.    Cyrena Mylar, MD 07/25/24 [phone]

## 2024-07-25 NOTE — Discharge Instructions (Signed)
 I think you sprained a ligament in your knee.  You can use a knee immobilizer and crutches for comfort but if you are able to bear weight, do so.  Call Dr. Lorelle (or your previous orthopedic doctor) for a follow-up appointment.  Take acetaminophen  650 mg and ibuprofen 400 mg every 6 hours for pain.  Take with food. Thank you for choosing us  for your health care today!  Please see your primary doctor this week for a follow up appointment.   If you have any new, worsening, or unexpected symptoms call your doctor right away or come back to the emergency department for reevaluation.  It was my pleasure to care for you today.   Ginnie EDISON Cyrena, MD

## 2024-07-26 ENCOUNTER — Telehealth (INDEPENDENT_AMBULATORY_CARE_PROVIDER_SITE_OTHER): Payer: Self-pay

## 2024-07-26 NOTE — Telephone Encounter (Signed)
 LVM for patient asking if he would like to just switch legs for the laser procedure. So for the 10.17.25 appt, instead of doing the left leg, we would do the right leg. We are up against the expiration date of 11.28.25 to get bilateral laser ablation procedures. I asked for a return call to the office.

## 2024-07-26 NOTE — Telephone Encounter (Signed)
 Call from patient on nurse line wanting to advise that he re-injured his left knee recently and had been to the emergency room for treatment of this on the 8th of October. Patient would like to know if the laser he Is due to have on the 17 th will further complicate this injury or if he will be ok to keep appointment as scheduled and if so what risks there are with doing so. Please advise.

## 2024-08-02 ENCOUNTER — Ambulatory Visit (INDEPENDENT_AMBULATORY_CARE_PROVIDER_SITE_OTHER): Admitting: Vascular Surgery

## 2024-08-02 VITALS — BP 151/89 | HR 55

## 2024-08-02 DIAGNOSIS — I8312 Varicose veins of left lower extremity with inflammation: Secondary | ICD-10-CM

## 2024-08-02 DIAGNOSIS — I8311 Varicose veins of right lower extremity with inflammation: Secondary | ICD-10-CM

## 2024-08-02 NOTE — Progress Notes (Signed)
 Benjamin Andrews is a 58 y.o. male who presents with symptomatic venous reflux  Past Medical History:  Diagnosis Date   Allergic rhinitis    Anginal pain    Blood in stool    Cervical radiculopathy at C6    Chickenpox    Generalized anxiety disorder    Headache    Hemorrhoids    High blood pressure    Seizures (HCC) 1998   diagnosed in 2000. last seizure 2016   Sleep apnea    UTI (lower urinary tract infection)     Past Surgical History:  Procedure Laterality Date   ANTERIOR CERVICAL DECOMP/DISCECTOMY FUSION N/A 06/13/2022   Procedure: C5-6 ANTERIOR CERVICAL DISCECTOMY AND FUSION (GLOBUS HEDRON);  Surgeon: Clois Fret, MD;  Location: [facility] ORS;  Service: Neurosurgery;  Laterality: N/A;   COLONOSCOPY WITH PROPOFOL  N/A 12/04/2015   Procedure: COLONOSCOPY WITH PROPOFOL ;  Surgeon: Lamar ONEIDA Holmes, MD;  Location: [facility] ENDOSCOPY;  Service: Endoscopy;  Laterality: N/A;   COLONOSCOPY WITH PROPOFOL  N/A 09/30/2022   Procedure: COLONOSCOPY WITH PROPOFOL ;  Surgeon: Maryruth Ole ONEIDA, MD;  Location: [facility] ENDOSCOPY;  Service: Endoscopy;  Laterality: N/A;   RECONSTRUCTION OF NOSE     fracture   SHOULDER SURGERY Right 2000   bicep repair     Current Outpatient Medications:    alfuzosin  (UROXATRAL ) 10 MG 24 hr tablet, TAKE 1 TABLET BY MOUTH DAILY  WITH BREAKFAST, Disp: 90 tablet, Rfl: 3   ALPRAZolam  (XANAX ) 0.5 MG tablet, Take 1st tablet 1 hour before procedure then take 2nd tablet once arrived at the office, Disp: 2 tablet, Rfl: 0   ALPRAZolam  (XANAX ) 0.5 MG tablet, Take 1st tablet 1 hour before procedure then take 2nd tablet once arrived at the office, Disp: 2 tablet, Rfl: 0   Ascorbic Acid (VITAMIN C) 100 MG tablet, Take 100 mg by mouth daily., Disp: , Rfl:    BIOTIN PO, Take 1 tablet by mouth daily., Disp: , Rfl:    busPIRone  (BUSPAR ) 10 MG tablet, Take 1 tablet (10 mg total) by mouth 2 (two) times daily., Disp: 180 tablet, Rfl: 3   COPPER PO, Take 1 tablet by mouth daily., Disp: ,  Rfl:    dutasteride  (AVODART ) 0.5 MG capsule, Take 1 capsule (0.5 mg total) by mouth daily., Disp: 90 capsule, Rfl: 3   eszopiclone  (LUNESTA ) 1 MG TABS tablet, Take 1 tablet (1 mg total) by mouth at bedtime as needed for sleep. Take immediately before bedtime, Disp: 90 tablet, Rfl: 1   MAGNESIUM PO, Take 3 tablets by mouth daily., Disp: , Rfl:    Misc Natural Products (JOINT HEALTH PO), Take 1 tablet by mouth daily., Disp: , Rfl:    Multiple Vitamin (MULTIVITAMIN) tablet, Take 1 tablet by mouth daily., Disp: , Rfl:    Multiple Vitamins-Minerals (ZINC PO), Take 1 tablet by mouth daily., Disp: , Rfl:    OMEGA-3 FATTY ACIDS PO, Take 1 tablet by mouth daily., Disp: , Rfl:    Oxcarbazepine  (TRILEPTAL ) 300 MG tablet, Take 1 tablet (300 mg total) by mouth 2 (two) times daily., Disp: 180 tablet, Rfl: 4   rosuvastatin  (CRESTOR ) 10 MG tablet, Take 1 tablet (10 mg total) by mouth daily., Disp: 90 tablet, Rfl: 3   Sodium Hyaluronate, oral, (HYALURONIC ACID PO), Take 1 tablet by mouth daily., Disp: , Rfl:    tadalafil  (CIALIS ) 10 MG tablet, Take 1 tablet (10 mg total) by mouth daily as needed for erectile dysfunction. 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 The ultrasound machine was used to visualize the saphenous vein throughout its course. A segment just above the knee was selected for access. The saphenous vein was accessed with a mild amount of difficulty using ultrasound guidance with a micropuncture needle. A 0.018 wire was then placed beyond the saphenofemoral junction and the needle was removed. The 65 cm sheath was then placed over the wire and the wire and  dilator were removed. The laser fiber was then placed through the sheath and its tip was placed approximately 5 centimeters below the saphenofemoral junction. Tumescent anesthesia was then created with a dilute lidocaine  solution. Laser energy was then delivered with constant withdrawal of the sheath and laser fiber. Approximately 1263 joules of energy were delivered over a length of 28 centimeters using a 1470 Hz VenaCure machine at 7 W. Sterile dressings were placed. The patient tolerated the procedure well without obvious complications.   Follow-up in 1 week with post-laser duplex.

## 2024-08-09 ENCOUNTER — Other Ambulatory Visit (INDEPENDENT_AMBULATORY_CARE_PROVIDER_SITE_OTHER)

## 2024-08-09 DIAGNOSIS — I8311 Varicose veins of right lower extremity with inflammation: Secondary | ICD-10-CM

## 2024-08-09 DIAGNOSIS — I8312 Varicose veins of left lower extremity with inflammation: Secondary | ICD-10-CM

## 2024-08-12 DIAGNOSIS — M25562 Pain in left knee: Secondary | ICD-10-CM | POA: Diagnosis not present

## 2024-08-12 DIAGNOSIS — G8929 Other chronic pain: Secondary | ICD-10-CM | POA: Diagnosis not present

## 2024-08-26 ENCOUNTER — Other Ambulatory Visit (INDEPENDENT_AMBULATORY_CARE_PROVIDER_SITE_OTHER): Payer: Self-pay | Admitting: Nurse Practitioner

## 2024-08-29 ENCOUNTER — Other Ambulatory Visit (INDEPENDENT_AMBULATORY_CARE_PROVIDER_SITE_OTHER): Payer: Self-pay

## 2024-08-29 ENCOUNTER — Telehealth (INDEPENDENT_AMBULATORY_CARE_PROVIDER_SITE_OTHER): Payer: Self-pay

## 2024-08-29 NOTE — Telephone Encounter (Signed)
 Called patient at this time and let him know his rx for xanax  for his procedure tomorrow has been sent to his pharmacy at this time.

## 2024-08-29 NOTE — Telephone Encounter (Signed)
 Patient called in reference to RX for xanax  for laser ablation tomorrow, patient stated the medication is not at the pharmacy at this time and he needs it in order to have procedure tomorrow. I called the [facility] pharmacy and spoke to the pharmacist and the RX needs to be signed. Provider aware.

## 2024-08-30 ENCOUNTER — Ambulatory Visit (INDEPENDENT_AMBULATORY_CARE_PROVIDER_SITE_OTHER): Admitting: Nurse Practitioner

## 2024-08-30 ENCOUNTER — Ambulatory Visit (INDEPENDENT_AMBULATORY_CARE_PROVIDER_SITE_OTHER): Admitting: Vascular Surgery

## 2024-08-30 VITALS — BP 135/83 | Resp 18 | Wt 235.0 lb

## 2024-08-30 DIAGNOSIS — I8311 Varicose veins of right lower extremity with inflammation: Secondary | ICD-10-CM | POA: Diagnosis not present

## 2024-08-30 DIAGNOSIS — I8312 Varicose veins of left lower extremity with inflammation: Secondary | ICD-10-CM

## 2024-08-30 NOTE — Progress Notes (Signed)
 Benjamin Andrews is a 58 y.o. male who presents with symptomatic venous reflux  Past Medical History:  Diagnosis Date   Allergic rhinitis    Anginal pain    Blood in stool    Cervical radiculopathy at C6    Chickenpox    Generalized anxiety disorder    Headache    Hemorrhoids    High blood pressure    Seizures (HCC) 1998   diagnosed in 2000. last seizure 2016   Sleep apnea    UTI (lower urinary tract infection)     Past Surgical History:  Procedure Laterality Date   ANTERIOR CERVICAL DECOMP/DISCECTOMY FUSION N/A 06/13/2022   Procedure: C5-6 ANTERIOR CERVICAL DISCECTOMY AND FUSION (GLOBUS HEDRON);  Surgeon: Clois Fret, MD;  Location: [facility] ORS;  Service: Neurosurgery;  Laterality: N/A;   COLONOSCOPY WITH PROPOFOL  N/A 12/04/2015   Procedure: COLONOSCOPY WITH PROPOFOL ;  Surgeon: Lamar ONEIDA Holmes, MD;  Location: [facility] ENDOSCOPY;  Service: Endoscopy;  Laterality: N/A;   COLONOSCOPY WITH PROPOFOL  N/A 09/30/2022   Procedure: COLONOSCOPY WITH PROPOFOL ;  Surgeon: Maryruth Ole ONEIDA, MD;  Location: [facility] ENDOSCOPY;  Service: Endoscopy;  Laterality: N/A;   RECONSTRUCTION OF NOSE     fracture   SHOULDER SURGERY Right 2000   bicep repair     Current Outpatient Medications:    alfuzosin  (UROXATRAL ) 10 MG 24 hr tablet, TAKE 1 TABLET BY MOUTH DAILY  WITH BREAKFAST, Disp: 90 tablet, Rfl: 3   Ascorbic Acid (VITAMIN C) 100 MG tablet, Take 100 mg by mouth daily., Disp: , Rfl:    BIOTIN PO, Take 1 tablet by mouth daily., Disp: , Rfl:    busPIRone  (BUSPAR ) 10 MG tablet, Take 1 tablet (10 mg total) by mouth 2 (two) times daily., Disp: 180 tablet, Rfl: 3   COPPER PO, Take 1 tablet by mouth daily., Disp: , Rfl:    dutasteride  (AVODART ) 0.5 MG capsule, Take 1 capsule (0.5 mg total) by mouth daily., Disp: 90 capsule, Rfl: 3   eszopiclone  (LUNESTA ) 1 MG TABS tablet, Take 1 tablet (1 mg total) by mouth at bedtime as needed for sleep. Take immediately before bedtime, Disp: 90 tablet, Rfl: 1    MAGNESIUM PO, Take 3 tablets by mouth daily., Disp: , Rfl:    Misc Natural Products (JOINT HEALTH PO), Take 1 tablet by mouth daily., Disp: , Rfl:    Multiple Vitamin (MULTIVITAMIN) tablet, Take 1 tablet by mouth daily., Disp: , Rfl:    Multiple Vitamins-Minerals (ZINC PO), Take 1 tablet by mouth daily., Disp: , Rfl:    OMEGA-3 FATTY ACIDS PO, Take 1 tablet by mouth daily., Disp: , Rfl:    Oxcarbazepine  (TRILEPTAL ) 300 MG tablet, Take 1 tablet (300 mg total) by mouth 2 (two) times daily., Disp: 180 tablet, Rfl: 4   rosuvastatin  (CRESTOR ) 10 MG tablet, Take 1 tablet (10 mg total) by mouth daily., Disp: 90 tablet, Rfl: 3   Sodium Hyaluronate, oral, (HYALURONIC ACID PO), Take 1 tablet by mouth daily., Disp: , Rfl:    tadalafil  (CIALIS ) 10 MG tablet, Take 1 tablet (10 mg total) by mouth daily as needed for erectile dysfunction. 1-2 tabs 1 hr prior to intercourse, Disp: 30 tablet, Rfl: 3   tadalafil  (CIALIS ) 5 MG tablet, Take 1 tablet (5 mg total) by mouth daily., Disp: 90 tablet, Rfl: 3   VITAMIN D PO, Take 1 tablet by mouth daily., Disp: , Rfl:    ALPRAZolam  (XANAX ) 0.5 MG tablet, Take 1st tablet 1 hour before procedure then take 2nd tablet  once arrived at the office (Patient not taking: Reported on 08/30/2024), Disp: 2 tablet, Rfl: 0   ALPRAZolam  (XANAX ) 0.5 MG tablet, TAKE 1ST TABLET BY MOUTH ONE HOUR BEFORE PROCEDURE AND TAKE 2ND TABLET ONCE ARRIVED TO OFFICE (Patient not taking: Reported on 08/30/2024), Disp: 2 tablet, Rfl: 0  No Known Allergies   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 The ultrasound machine was used to visualize the saphenous vein throughout its course. A segment at the level of the knee was selected for access. The saphenous vein was accessed with minimal difficulty using ultrasound guidance with a micropuncture needle. A 0.018 wire was then placed beyond the saphenofemoral  junction and the needle was removed. The 65 cm sheath was then placed over the wire and the wire and dilator were removed. The laser fiber was then placed through the sheath and its tip was placed approximately 5 centimeters below the saphenofemoral junction. Tumescent anesthesia was then created with a dilute lidocaine  solution. Laser energy was then delivered with constant withdrawal of the sheath and laser fiber. Approximately 1579 joules of energy were delivered over a length of 34 centimeters using a 1470 Hz VenaCure machine at 7 W. Sterile dressings were placed. The patient tolerated the procedure well without obvious complications.   Follow-up in 1 week with post-laser duplex.

## 2024-09-04 ENCOUNTER — Other Ambulatory Visit (INDEPENDENT_AMBULATORY_CARE_PROVIDER_SITE_OTHER): Payer: Self-pay | Admitting: Vascular Surgery

## 2024-09-04 DIAGNOSIS — I8311 Varicose veins of right lower extremity with inflammation: Secondary | ICD-10-CM

## 2024-09-06 ENCOUNTER — Ambulatory Visit (INDEPENDENT_AMBULATORY_CARE_PROVIDER_SITE_OTHER)

## 2024-09-06 DIAGNOSIS — I8312 Varicose veins of left lower extremity with inflammation: Secondary | ICD-10-CM

## 2024-09-06 DIAGNOSIS — I8311 Varicose veins of right lower extremity with inflammation: Secondary | ICD-10-CM | POA: Diagnosis not present

## 2024-09-20 ENCOUNTER — Encounter: Payer: Self-pay | Admitting: Nurse Practitioner

## 2024-09-27 ENCOUNTER — Encounter (INDEPENDENT_AMBULATORY_CARE_PROVIDER_SITE_OTHER): Payer: Self-pay | Admitting: Nurse Practitioner

## 2024-09-27 ENCOUNTER — Ambulatory Visit (INDEPENDENT_AMBULATORY_CARE_PROVIDER_SITE_OTHER): Admitting: Nurse Practitioner

## 2024-09-27 VITALS — BP 142/85 | HR 61 | Resp 18 | Ht 75.0 in | Wt 238.0 lb

## 2024-09-27 DIAGNOSIS — I8311 Varicose veins of right lower extremity with inflammation: Secondary | ICD-10-CM | POA: Diagnosis not present

## 2024-09-27 DIAGNOSIS — I8312 Varicose veins of left lower extremity with inflammation: Secondary | ICD-10-CM

## 2024-09-28 ENCOUNTER — Other Ambulatory Visit: Payer: Self-pay | Admitting: Urology

## 2024-09-28 ENCOUNTER — Other Ambulatory Visit: Payer: Self-pay | Admitting: Neurology

## 2024-09-28 DIAGNOSIS — R569 Unspecified convulsions: Secondary | ICD-10-CM

## 2024-10-02 NOTE — Telephone Encounter (Signed)
 Last seen on 09/28/23 Follow up scheduled  10/03/24

## 2024-10-03 ENCOUNTER — Encounter: Payer: Self-pay | Admitting: Neurology

## 2024-10-03 ENCOUNTER — Ambulatory Visit: Payer: BC Managed Care – PPO | Admitting: Neurology

## 2024-10-03 DIAGNOSIS — R569 Unspecified convulsions: Secondary | ICD-10-CM | POA: Diagnosis not present

## 2024-10-03 MED ORDER — OXCARBAZEPINE 300 MG PO TABS: 300.0000 mg | ORAL_TABLET | Freq: Two times a day (BID) | ORAL | 4 refills | Status: AC

## 2024-10-03 NOTE — Progress Notes (Signed)
 PATIENT: Benjamin Andrews DOB: [birth year]  REASON FOR VISIT: follow up HISTORY FROM: patient Primary Neurologist: Dr. Onita   HISTORY  Benjamin Andrews is a 57 year old male, seen in request by his primary care physician Dr. Maribeth Locus for evaluation of seizure   I have reviewed and summarized the referring note from the referring physician.  He had a history of anxiety, taking Buspar  10mg  bid, anxiety, also history of nocturnal seizure, taking Trileptal  300mg  bid.used to have sleep apnea, he lost 20 pounds recently, is no longer using CPAP machine   I was able to review his previous neurologist's note, Dr. Birder Lerner from [facility], most recent office note was on July 11, 2018, he has retired.  Patient reported a history of nocturnal seizure since 1998, there was a delayed diagnosis, he presented with right shoulder dislocation 3 times after woke up from overnight sleep, eventually had right shoulder surgery, shortly following his right shoulder surgery, he had a witnessed nocturnal generalized tonic-clonic seizure in 2000   Per patient, EEG, and MRI showed no significant abnormality, he was treated with a different antiepileptic medication, could not tolerated, then he was treated with Trileptal  150 mg 3 times a day, complains of daytime sleepiness, he has been on stable dose Trileptal  150mg  2 times a day for a long time, in 2015, in attempt to wean off Trileptal , he suffered another recurrent nocturnal seizure, woke up with whole-body muscle achy pain   He works as a chartered certified accountant, does not want to have MRI of the brain, or EEG at this point   His maternal aunt, and maternal great grandmother suffered epilepsy,  Update October 01, 2020 SS: Here today for routine follow-up, continues to do well, last seizure was 3 years ago when he forgot to take his medication, has nocturnal seizures only. Knows had seizure because wakes up really sore.  Remains on Trileptal  300 mg twice a day.   Tolerates well.  Previously had MRI of the brain and EEG with prior neurologist.  Continues to do well.  Works as a chartered certified accountant.  Update September 30, 2021 SS: Continues to do well, no recurrent seizure, remains on Trileptal  300 mg twice a day.  Tolerates well.  Continues to work full-time as a chartered certified accountant, he has the rest of the year off.  Leaving today to travel to [facility] to see his ill mother.   Update September 27, 2022 SS: Injured his neck after heavy lifting, In August had C5-6 discectomy and fusion at [facility] with Dr. Clois. He did very well. He just went back to work few weeks ago. Denies any seizures. Remains on Trileptal . Labs 09/30/22 platelets 132, normal CMP, Trileptal  level 6. Has been 5 years since last seizure.   Update September 28, 2023 SS: Labs at last visit showed AST 46, ALT 57, Trileptal  level 5.  Recent hepatic function panel October 2024 showed normal AST, ALT. No seizures. Remains on Trileptal  300 mg BID. Has been taking Liver detox pill OTC, reduced Crestor . Had steroid shot to right knee. Doing very good.   Update 10/03/24 SS: cbc, cmp looked good, trileptal  level 6. No seizures, remains on Trileptal  300 mg BID. Issues with varicose legs. Going to vascular.   REVIEW OF SYSTEMS: Out of a complete 14 system review of symptoms, the patient complains only of the following symptoms, and all other reviewed systems are negative.  N/A  ALLERGIES: No Known Allergies  HOME MEDICATIONS: Outpatient Medications Prior to Visit  Medication Sig Dispense Refill  alfuzosin  (UROXATRAL ) 10 MG 24 hr tablet TAKE 1 TABLET BY MOUTH DAILY  WITH BREAKFAST 90 tablet 3   Ascorbic Acid (VITAMIN C) 100 MG tablet Take 100 mg by mouth daily.     BIOTIN PO Take 1 tablet by mouth daily.     busPIRone  (BUSPAR ) 10 MG tablet Take 1 tablet (10 mg total) by mouth 2 (two) times daily. 180 tablet 3   COPPER PO Take 1 tablet by mouth daily.     dutasteride  (AVODART ) 0.5 MG capsule Take 1 capsule  (0.5 mg total) by mouth daily. 90 capsule 3   eszopiclone  (LUNESTA ) 1 MG TABS tablet Take 1 tablet (1 mg total) by mouth at bedtime as needed for sleep. Take immediately before bedtime 90 tablet 1   MAGNESIUM PO Take 3 tablets by mouth daily.     Misc Natural Products (JOINT HEALTH PO) Take 1 tablet by mouth daily.     Multiple Vitamin (MULTIVITAMIN) tablet Take 1 tablet by mouth daily.     Multiple Vitamins-Minerals (ZINC PO) Take 1 tablet by mouth daily.     OMEGA-3 FATTY ACIDS PO Take 1 tablet by mouth daily.     rosuvastatin  (CRESTOR ) 10 MG tablet Take 1 tablet (10 mg total) by mouth daily. 90 tablet 3   Sodium Hyaluronate, oral, (HYALURONIC ACID PO) Take 1 tablet by mouth daily.     tadalafil  (CIALIS ) 10 MG tablet Take 1 tablet (10 mg total) by mouth daily as needed for erectile dysfunction. 1-2 tabs 1 hr prior to intercourse 30 tablet 3   tadalafil  (CIALIS ) 5 MG tablet Take 1 tablet (5 mg total) by mouth daily. 90 tablet 3   VITAMIN D PO Take 1 tablet by mouth daily.     Oxcarbazepine  (TRILEPTAL ) 300 MG tablet TAKE 1 TABLET TWICE A DAY 180 tablet 0   ALPRAZolam  (XANAX ) 0.5 MG tablet Take 1st tablet 1 hour before procedure then take 2nd tablet once arrived at the office 2 tablet 0   ALPRAZolam  (XANAX ) 0.5 MG tablet TAKE 1ST TABLET BY MOUTH ONE HOUR BEFORE PROCEDURE AND TAKE 2ND TABLET ONCE ARRIVED TO OFFICE 2 tablet 0   No facility-administered medications prior to visit.    PAST MEDICAL HISTORY: Past Medical History:  Diagnosis Date   Allergic rhinitis    Anginal pain    Blood in stool    Cervical radiculopathy at C6    Chickenpox    Generalized anxiety disorder    Headache    Hemorrhoids    High blood pressure    Seizures (HCC) 1998   diagnosed in 2000. last seizure 2016   Sleep apnea    UTI (lower urinary tract infection)     PAST SURGICAL HISTORY: Past Surgical History:  Procedure Laterality Date   ANTERIOR CERVICAL DECOMP/DISCECTOMY FUSION N/A 06/13/2022   Procedure:  C5-6 ANTERIOR CERVICAL DISCECTOMY AND FUSION (GLOBUS HEDRON);  Surgeon: Clois Fret, MD;  Location: [facility] ORS;  Service: Neurosurgery;  Laterality: N/A;   COLONOSCOPY WITH PROPOFOL  N/A 12/04/2015   Procedure: COLONOSCOPY WITH PROPOFOL ;  Surgeon: Lamar ONEIDA Holmes, MD;  Location: [facility] ENDOSCOPY;  Service: Endoscopy;  Laterality: N/A;   COLONOSCOPY WITH PROPOFOL  N/A 09/30/2022   Procedure: COLONOSCOPY WITH PROPOFOL ;  Surgeon: Maryruth Ole ONEIDA, MD;  Location: [facility] ENDOSCOPY;  Service: Endoscopy;  Laterality: N/A;   RECONSTRUCTION OF NOSE     fracture   SHOULDER SURGERY Right 2000   bicep repair    FAMILY HISTORY: Family History  Problem Relation Age of Onset  Alcoholism Other        Grandparent   Arthritis Other        Grandparent   Stroke Other        Parent   Diabetes Other        Grandparent   Rectal cancer Maternal Uncle    Cancer Mother        unsure - started in back   Healthy Father     SOCIAL HISTORY: Social History   Socioeconomic History   Marital status: Married    Spouse name: Dorothyann Hope   Number of children: 1   Years of education: some college   Highest education level: Associate degree: occupational, scientist, product/process development, or vocational program  Occupational History   Occupation: chartered certified accountant  Tobacco Use   Smoking status: Never   Smokeless tobacco: Former    Types: Associate Professor status: Never Used  Substance and Sexual Activity   Alcohol use: Yes    Comment: occassional (once a month)   Drug use: No   Sexual activity: Yes  Other Topics Concern   Not on file  Social History Narrative   Lives at home with his significant other.   Two cups caffeine per day.   Social Drivers of Health   Tobacco Use: Medium Risk (10/03/2024)   Patient History    Smoking Tobacco Use: Never    Smokeless Tobacco Use: Former    Passive Exposure: Not on Actuary Strain: Low Risk (02/09/2023)   Overall Financial Resource Strain (CARDIA)     Difficulty of Paying Living Expenses: Not very hard  Food Insecurity: No Food Insecurity (02/09/2023)   Hunger Vital Sign    Worried About Running Out of Food in the Last Year: Never true    Ran Out of Food in the Last Year: Never true  Transportation Needs: No Transportation Needs (02/09/2023)   PRAPARE - Administrator, Civil Service (Medical): No    Lack of Transportation (Non-Medical): No  Physical Activity: Insufficiently Active (02/09/2023)   Exercise Vital Sign    Days of Exercise per Week: 2 days    Minutes of Exercise per Session: 20 min  Stress: No Stress Concern Present (02/09/2023)   [employer] of Occupational Health - Occupational Stress Questionnaire    Feeling of Stress : Only a little  Social Connections: Unknown (02/09/2023)   Social Connection and Isolation Panel    Frequency of Communication with Friends and Family: Never    Frequency of Social Gatherings with Friends and Family: Never    Attends Religious Services: Patient declined    Database Administrator or Organizations: No    Attends Engineer, Structural: Not on file    Marital Status: Married  Catering Manager Violence: Not on file  Depression (PHQ2-9): Low Risk (01/26/2024)   Depression (PHQ2-9)    PHQ-2 Score: 0  Alcohol Screen: Low Risk (02/09/2023)   Alcohol Screen    Last Alcohol Screening Score (AUDIT): 1  Housing: Unknown (04/21/2024)   Received from [facility]   Epic    Unable to Pay for Housing in the Last Year: Not on file    Number of Times Moved in the Last Year: Not on file    At any time in the past 12 months, were you homeless or living in a shelter (including now)?: No  Utilities: Not on file  Health Literacy: Not on file    PHYSICAL EXAM  Vitals:   10/03/24 0744  BP: 130/82  Pulse: 62  SpO2: 94%  Weight: 242 lb 6.4 oz (110 kg)  Height: 6' 3 (1.905 m)   Body mass index is 30.3 kg/m.  Generalized: Well developed, in no acute distress    Neurological examination  Mentation: Alert oriented to time, place, history taking. Follows all commands speech and language fluent, very pleasant Cranial nerve II-XII: Pupils were equal round reactive to light. Extraocular movements were full, visual field were full on confrontational test. Facial sensation and strength were normal.  Head turning and shoulder shrug  were normal and symmetric. Motor: The motor testing reveals 5 over 5 strength of all 4 extremities. Good symmetric motor tone is noted throughout.  Sensory: Sensory testing is intact to soft touch on all 4 extremities. No evidence of extinction is noted.  Coordination: Cerebellar testing reveals good finger-nose-finger and heel-to-shin bilaterally.  Gait and station: Gait is normal.   DIAGNOSTIC DATA (LABS, IMAGING, TESTING) - I reviewed patient records, labs, notes, testing and imaging myself where available.  Lab Results  Component Value Date   WBC 8.0 07/24/2024   HGB 15.3 07/24/2024   HCT 45.3 07/24/2024   MCV 88.8 07/24/2024   PLT 146 (L) 07/24/2024      Component Value Date/Time   NA 138 07/24/2024 1944   NA 142 01/26/2024 1419   K 3.9 07/24/2024 1944   CL 102 07/24/2024 1944   CO2 27 07/24/2024 1944   GLUCOSE 103 (H) 07/24/2024 1944   BUN 24 (H) 07/24/2024 1944   BUN 15 01/26/2024 1419   CREATININE 1.05 07/24/2024 1944   CREATININE 1.01 01/20/2023 1447   CALCIUM  9.3 07/24/2024 1944   PROT 6.8 03/15/2024 1519   PROT 7.1 01/26/2024 1419   ALBUMIN 4.4 01/26/2024 1419   AST 26 03/15/2024 1519   ALT 26 03/15/2024 1519   ALKPHOS 106 01/26/2024 1419   BILITOT 0.6 03/15/2024 1519   BILITOT 0.4 01/26/2024 1419   GFRNONAA >60 07/24/2024 1944   GFRAA 108 10/01/2020 0807   Lab Results  Component Value Date   CHOL 133 03/15/2024   HDL 40 03/15/2024   LDLCALC 74 03/15/2024   LDLDIRECT 80.0 03/29/2023   TRIG 106 03/15/2024   CHOLHDL 3.3 03/15/2024   Lab Results  Component Value Date   HGBA1C 5.5  01/26/2024   No results found for: VITAMINB12 Lab Results  Component Value Date   TSH 1.220 01/26/2024   ASSESSMENT AND PLAN 58 y.o. year old male  has a past medical history of Allergic rhinitis, Anginal pain, Blood in stool, Cervical radiculopathy at C6, Chickenpox, Generalized anxiety disorder, Headache, Hemorrhoids, High blood pressure, Seizures (HCC) (1998), Sleep apnea, and UTI (lower urinary tract infection). here with:  1.  Epilepsy, last seizure 2015 - Doing very well, no recent seizure - Continue Trileptal  300 mg twice a day - I reviewed recent labs back in October - Call for seizure activity, follow-up in 1 year  Meds ordered this encounter  Medications   Oxcarbazepine  (TRILEPTAL ) 300 MG tablet    Sig: Take 1 tablet (300 mg total) by mouth 2 (two) times daily.    Dispense:  180 tablet    Refill:  4   Lauraine Born, [place], [place] 10/03/2024, 8:05 AM [facility] [address], [address] [place], [place] [address] [phone]

## 2024-10-03 NOTE — Patient Instructions (Signed)
 Great to see you today! Continue Trileptal  for seizure prevention Call for seizure concern Follow-up 1 year.  Thanks!!

## 2024-10-06 ENCOUNTER — Encounter (INDEPENDENT_AMBULATORY_CARE_PROVIDER_SITE_OTHER): Payer: Self-pay | Admitting: Nurse Practitioner

## 2024-10-06 NOTE — Progress Notes (Signed)
 "  Subjective:    Patient ID: Benjamin Andrews, male    DOB: 12-02-1965, 58 y.o.   MRN: [id number] Chief Complaint  Patient presents with   Follow-up    4 and 8 week post op post bilateral GSV laser scab on R leg seems to not be healing from last procedure    HPI  Discussed the use of AI scribe software for clinical note transcription with the patient, who gave verbal consent to proceed.  History of Present Illness Benjamin Andrews is a 58 year old male who presents with persistent leg aching and pressure post-laser treatment for varicose veins.  After undergoing laser treatment for varicose veins, he experiences persistent aching and pressure in his lower legs, particularly when sitting or reclining at night. The sensation is described as a 'slight toothache' type of ache, primarily affecting one leg. Although the appearance of his veins has improved, some veins remain visible and cause discomfort.  Prior to the procedure, he experienced significant pressure in the lower part of his legs, which has since improved. However, he still experiences aches when reclining, described as a phantom pain. He has a history of tearing his calf muscle three times, resulting in one calf being smaller than the other.  He recalls an incident where his leg gave out, leading to an ER visit where he was placed in an immobile cast. Initially, he thought the pain was due to varicose veins, but was informed it might have been a strain. A steroid shot provided significant pain relief.  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 He expresses concern about overextending his leg and has previously consulted an orthopedic specialist who provided a steroid shot for pain relief.  Family history includes concerns from his wife, whose father had severe varicose veins, prompting her to worry about his condition worsening.    Results     Review of  Systems  Musculoskeletal:  Positive for myalgias.  All other systems reviewed and are negative.      Objective:   Physical Exam Vitals reviewed.  HENT:     Head: Normocephalic.  Cardiovascular:     Rate and Rhythm: Normal rate.     Pulses: Normal pulses.  Pulmonary:     Effort: Pulmonary effort is normal.  Skin:    General: Skin is warm and dry.  Neurological:     Mental Status: He is alert and oriented to person, place, and time.  Psychiatric:        Mood and Affect: Mood normal.        Behavior: Behavior normal.        Thought Content: Thought content normal.        Judgment: Judgment normal.     Physical Exam    BP (!) 142/85 (BP Location: Left Arm, Patient Position: Sitting, Cuff Size: Normal)   Pulse 61   Resp 18   Ht 6' 3 (1.905 m)   Wt 238 lb (108 kg)   BMI 29.75 kg/m   Past Medical History:  Diagnosis Date   Allergic rhinitis    Anginal pain    Blood in stool    Cervical radiculopathy at C6    Chickenpox    Generalized anxiety disorder    Headache    Hemorrhoids    High blood pressure    Seizures (HCC) 1998   diagnosed in 2000. last seizure 2016   Sleep apnea  UTI (lower urinary tract infection)     Social History   Socioeconomic History   Marital status: Married    Spouse name: Dorothyann Hope   Number of children: 1   Years of education: some college   Highest education level: Associate degree: occupational, scientist, product/process development, or vocational program  Occupational History   Occupation: chartered certified accountant  Tobacco Use   Smoking status: Never   Smokeless tobacco: Former    Types: Associate Professor status: Never Used  Substance and Sexual Activity   Alcohol use: Yes    Comment: occassional (once a month)   Drug use: No   Sexual activity: Yes  Other Topics Concern   Not on file  Social History Narrative   Lives at home with his significant other.   Two cups caffeine per day.   Social Drivers of Health   Tobacco Use: Medium Risk  (10/03/2024)   Patient History    Smoking Tobacco Use: Never    Smokeless Tobacco Use: Former    Passive Exposure: Not on Actuary Strain: Low Risk (02/09/2023)   Overall Financial Resource Strain (CARDIA)    Difficulty of Paying Living Expenses: Not very hard  Food Insecurity: No Food Insecurity (02/09/2023)   Hunger Vital Sign    Worried About Running Out of Food in the Last Year: Never true    Ran Out of Food in the Last Year: Never true  Transportation Needs: No Transportation Needs (02/09/2023)   PRAPARE - Administrator, Civil Service (Medical): No    Lack of Transportation (Non-Medical): No  Physical Activity: Insufficiently Active (02/09/2023)   Exercise Vital Sign    Days of Exercise per Week: 2 days    Minutes of Exercise per Session: 20 min  Stress: No Stress Concern Present (02/09/2023)   [employer] of Occupational Health - Occupational Stress Questionnaire    Feeling of Stress : Only a little  Social Connections: Unknown (02/09/2023)   Social Connection and Isolation Panel    Frequency of Communication with Friends and Family: Never    Frequency of Social Gatherings with Friends and Family: Never    Attends Religious Services: Patient declined    Database Administrator or Organizations: No    Attends Engineer, Structural: Not on file    Marital Status: Married  Catering Manager Violence: Not on file  Depression (PHQ2-9): Low Risk (01/26/2024)   Depression (PHQ2-9)    PHQ-2 Score: 0  Alcohol Screen: Low Risk (02/09/2023)   Alcohol Screen    Last Alcohol Screening Score (AUDIT): 1  Housing: Unknown (04/21/2024)   Received from [facility]   Epic    Unable to Pay for Housing in the Last Year: Not on file    Number of Times Moved in the Last Year: Not on file    At any time in the past 12 months, were you homeless or living in a shelter (including now)?: No  Utilities: Not on file  Health Literacy: Not on file     Past Surgical History:  Procedure Laterality Date   ANTERIOR CERVICAL DECOMP/DISCECTOMY FUSION N/A 06/13/2022   Procedure: C5-6 ANTERIOR CERVICAL DISCECTOMY AND FUSION (GLOBUS HEDRON);  Surgeon: Clois Fret, MD;  Location: [facility] ORS;  Service: Neurosurgery;  Laterality: N/A;   COLONOSCOPY WITH PROPOFOL  N/A 12/04/2015   Procedure: COLONOSCOPY WITH PROPOFOL ;  Surgeon: Lamar ONEIDA Holmes, MD;  Location: [facility] ENDOSCOPY;  Service: Endoscopy;  Laterality: N/A;  COLONOSCOPY WITH PROPOFOL  N/A 09/30/2022   Procedure: COLONOSCOPY WITH PROPOFOL ;  Surgeon: Maryruth Ole DASEN, MD;  Location: [facility] ENDOSCOPY;  Service: Endoscopy;  Laterality: N/A;   RECONSTRUCTION OF NOSE     fracture   SHOULDER SURGERY Right 2000   bicep repair    Family History  Problem Relation Age of Onset   Alcoholism Other        Grandparent   Arthritis Other        Grandparent   Stroke Other        Parent   Diabetes Other        Grandparent   Rectal cancer Maternal Uncle    Cancer Mother        unsure - started in back   Healthy Father     Allergies[1]     Latest Ref Rng & Units 07/24/2024    7:44 PM 01/26/2024    2:19 PM 09/28/2023    8:11 AM  CBC  WBC 4.0 - 10.5 K/uL 8.0  7.2  7.0   Hemoglobin 13.0 - 17.0 g/dL 84.6  83.1  83.2   Hematocrit 39.0 - 52.0 % 45.3  52.4  51.1   Platelets 150 - 400 K/uL 146  167  160       CMP     Component Value Date/Time   NA 138 07/24/2024 1944   NA 142 01/26/2024 1419   K 3.9 07/24/2024 1944   CL 102 07/24/2024 1944   CO2 27 07/24/2024 1944   GLUCOSE 103 (H) 07/24/2024 1944   BUN 24 (H) 07/24/2024 1944   BUN 15 01/26/2024 1419   CREATININE 1.05 07/24/2024 1944   CREATININE 1.01 01/20/2023 1447   CALCIUM  9.3 07/24/2024 1944   PROT 6.8 03/15/2024 1519   PROT 7.1 01/26/2024 1419   ALBUMIN 4.4 01/26/2024 1419   AST 26 03/15/2024 1519   ALT 26 03/15/2024 1519   ALKPHOS 106 01/26/2024 1419   BILITOT 0.6 03/15/2024 1519   BILITOT 0.4 01/26/2024 1419   GFR  90.65 10/06/2017 1506   EGFR 94 01/26/2024 1419   GFRNONAA >60 07/24/2024 1944     No results found.     Assessment & Plan:   1. 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  Patient should undergo injection sclerotherapy to treat the residual varicosities.  The risks, benefits and alternative therapies were reviewed in detail with the patient.  All questions were answered.  The patient agrees to proceed with sclerotherapy at their convenience.  The patient will continue wearing the graduated compression stockings and using the over-the-counter pain medications to treat her symptoms.       Medications Ordered Prior to Encounter[2]  There are no Patient Instructions on file for this visit. No follow-ups on file.   Sahalie Beth E Alika Saladin, NP      [1] No Known Allergies [2]  Current Outpatient Medications on File Prior to Visit  Medication Sig Dispense Refill   Ascorbic Acid (VITAMIN C) 100 MG tablet Take 100 mg by mouth daily.     BIOTIN PO Take 1 tablet by mouth daily.     busPIRone  (BUSPAR ) 10 MG tablet Take 1 tablet (10 mg total) by mouth 2 (two) times daily. 180 tablet 3   COPPER PO Take 1 tablet  by mouth daily.     dutasteride  (AVODART ) 0.5 MG capsule Take 1 capsule (0.5 mg total) by mouth daily. 90 capsule 3   eszopiclone  (LUNESTA ) 1 MG TABS tablet Take 1 tablet (1 mg total) by mouth at bedtime as needed for sleep. Take immediately before bedtime 90 tablet 1   MAGNESIUM PO Take 3 tablets by mouth daily.     Misc Natural Products (JOINT HEALTH PO) Take 1 tablet by mouth daily.     Multiple Vitamin (MULTIVITAMIN) tablet Take 1 tablet by mouth daily.     Multiple Vitamins-Minerals (ZINC PO) Take 1 tablet by mouth daily.      OMEGA-3 FATTY ACIDS PO Take 1 tablet by mouth daily.     rosuvastatin  (CRESTOR ) 10 MG tablet Take 1 tablet (10 mg total) by mouth daily. 90 tablet 3   Sodium Hyaluronate, oral, (HYALURONIC ACID PO) Take 1 tablet by mouth daily.     tadalafil  (CIALIS ) 10 MG tablet Take 1 tablet (10 mg total) by mouth daily as needed for erectile dysfunction. 1-2 tabs 1 hr prior to intercourse 30 tablet 3   tadalafil  (CIALIS ) 5 MG tablet Take 1 tablet (5 mg total) by mouth daily. 90 tablet 3   VITAMIN D PO Take 1 tablet by mouth daily.     No current facility-administered medications on file prior to visit.   "

## 2024-10-25 ENCOUNTER — Encounter: Payer: Self-pay | Admitting: Sleep Medicine

## 2024-10-25 ENCOUNTER — Encounter: Payer: Self-pay | Admitting: Neurology

## 2024-10-25 ENCOUNTER — Ambulatory Visit: Admitting: Sleep Medicine

## 2024-10-25 VITALS — BP 118/80 | HR 58 | Temp 98.3°F | Ht 75.0 in | Wt 243.2 lb

## 2024-10-25 DIAGNOSIS — F5104 Psychophysiologic insomnia: Secondary | ICD-10-CM

## 2024-10-25 DIAGNOSIS — G4733 Obstructive sleep apnea (adult) (pediatric): Secondary | ICD-10-CM | POA: Diagnosis not present

## 2024-10-25 DIAGNOSIS — F5101 Primary insomnia: Secondary | ICD-10-CM

## 2024-10-25 DIAGNOSIS — Z720 Tobacco use: Secondary | ICD-10-CM | POA: Diagnosis not present

## 2024-10-25 DIAGNOSIS — G4700 Insomnia, unspecified: Secondary | ICD-10-CM

## 2024-10-25 DIAGNOSIS — E669 Obesity, unspecified: Secondary | ICD-10-CM | POA: Diagnosis not present

## 2024-10-25 DIAGNOSIS — Z6830 Body mass index (BMI) 30.0-30.9, adult: Secondary | ICD-10-CM

## 2024-10-25 NOTE — Progress Notes (Signed)
 "      Name:Benjamin Andrews MRN: [id number] DOB: February 03, 1966   CHIEF COMPLAINT:  PSG F/U   HISTORY OF PRESENT ILLNESS: Benjamin Andrews is a 59 y.o. w/ a h/o epilepsy, insomnia, hyperlipidemia, anxiety and obesity who presents to follow up on PSG results. The patient underwent PSG which revealed mild to moderate OSA (AHI 5, O2 nadir 85%).     EPWORTH SLEEP SCORE     02/09/2024    9:14 AM  Results of the Epworth flowsheet  Sitting and reading 2  Watching TV 1  Sitting, inactive in a public place (e.g. a theatre or a meeting) 0  As a passenger in a car for an hour without a break 0  Lying down to rest in the afternoon when circumstances permit 3  Sitting and talking to someone 0  Sitting quietly after a lunch without alcohol 2  In a car, while stopped for a few minutes in traffic 0  Total score 8    PAST MEDICAL HISTORY :   has a past medical history of Allergic rhinitis, Anginal pain, Blood in stool, Cervical radiculopathy at C6, Chickenpox, Generalized anxiety disorder, Headache, Hemorrhoids, High blood pressure, Left knee sprain, Seizures (HCC), Sleep apnea, and UTI (lower urinary tract infection).  has a past surgical history that includes Shoulder surgery (Right, 2000); Reconstruction of nose; Colonoscopy with propofol  (N/A, 12/04/2015); Anterior cervical decomp/discectomy fusion (N/A, 06/13/2022); and Colonoscopy with propofol  (N/A, 09/30/2022). Prior to Admission medications  Medication Sig Start Date End Date Taking? Authorizing Provider  alfuzosin  (UROXATRAL ) 10 MG 24 hr tablet TAKE 1 TABLET BY MOUTH DAILY  WITH BREAKFAST 09/30/24  Yes Stoioff, Glendia BROCKS, MD  Ascorbic Acid (VITAMIN C) 100 MG tablet Take 100 mg by mouth daily.   Yes [provider]  BIOTIN PO Take 1 tablet by mouth daily.   Yes [provider]  busPIRone  (BUSPAR ) 10 MG tablet Take 1 tablet (10 mg total) by mouth 2 (two) times daily. 01/26/24  Yes Gretel App, NP  COPPER PO Take 1 tablet by mouth daily.    Yes [provider]  dutasteride  (AVODART ) 0.5 MG capsule Take 1 capsule (0.5 mg total) by mouth daily. 01/26/24  Yes Stoioff, Glendia BROCKS, MD  eszopiclone  (LUNESTA ) 1 MG TABS tablet Take 1 tablet (1 mg total) by mouth at bedtime as needed for sleep. Take immediately before bedtime 05/03/24  Yes Cobb, Comer GAILS, NP  MAGNESIUM PO Take 3 tablets by mouth daily.   Yes [provider]  Misc Natural Products (JOINT HEALTH PO) Take 1 tablet by mouth daily.   Yes [provider]  Multiple Vitamin (MULTIVITAMIN) tablet Take 1 tablet by mouth daily.   Yes [provider]  Multiple Vitamins-Minerals (ZINC PO) Take 1 tablet by mouth daily.   Yes [provider]  OMEGA-3 FATTY ACIDS PO Take 1 tablet by mouth daily.   Yes [provider]  Oxcarbazepine  (TRILEPTAL ) 300 MG tablet Take 1 tablet (300 mg total) by mouth 2 (two) times daily. 10/03/24  Yes Gayland Lauraine PARAS, NP  rosuvastatin  (CRESTOR ) 10 MG tablet Take 1 tablet (10 mg total) by mouth daily. 01/26/24  Yes Gretel App, NP  Sodium Hyaluronate, oral, (HYALURONIC ACID PO) Take 1 tablet by mouth daily.   Yes [provider]  tadalafil  (CIALIS ) 10 MG tablet Take 1 tablet (10 mg total) by mouth daily as needed for erectile dysfunction. 1-2 tabs 1 hr prior to intercourse 01/26/24  Yes Stoioff, Glendia BROCKS, MD  tadalafil  (CIALIS ) 5 MG tablet Take 1 tablet (5 mg total) by mouth daily. 01/26/24  Yes Stoioff, Glendia BROCKS, MD  VITAMIN D PO Take 1 tablet by mouth daily.   Yes [provider]   Allergies[1]  FAMILY HISTORY:  family history includes Alcoholism in an other family member; Arthritis in an other family member; Cancer in his mother; Diabetes in an other family member; Healthy in his father; Rectal cancer in his maternal uncle; Stroke in an other family member. SOCIAL HISTORY:  reports that he has never smoked. He has quit using smokeless tobacco.  His smokeless tobacco use included chew. He reports  current alcohol use. He reports that he does not use drugs.   Review of Systems:  Gen:  Denies  fever, sweats, chills weight loss  HEENT: Denies blurred vision, double vision, ear pain, eye pain, hearing loss, nose bleeds, sore throat Cardiac:  No dizziness, chest pain or heaviness, chest tightness,edema, No JVD Resp:   No cough, -sputum production, -shortness of breath,-wheezing, -hemoptysis,  Gi: Denies swallowing difficulty, stomach pain, nausea or vomiting, diarrhea, constipation, bowel incontinence Gu:  Denies bladder incontinence, burning urine Ext:   Denies Joint pain, stiffness or swelling Skin: Denies  skin rash, easy bruising or bleeding or hives Endoc:  Denies polyuria, polydipsia , polyphagia or weight change Psych:   Denies depression, insomnia or hallucinations  Other:  All other systems negative  VITAL SIGNS: BP 118/80   Pulse (!) 58   Temp 98.3 F (36.8 C)   Ht 6' 3 (1.905 m)   Wt 243 lb 3.2 oz (110.3 kg)   SpO2 97%   BMI 30.40 kg/m    Physical Examination:   General Appearance: No distress  EYES PERRLA, EOM intact.   NECK Supple, No JVD Pulmonary: normal breath sounds, No wheezing.  CardiovascularNormal S1,S2.  No m/r/g.   Abdomen: Benign, Soft, non-tender. Skin:   warm, no rashes, no ecchymosis  Extremities: normal, no cyanosis, clubbing. Neuro:without focal findings,  speech normal  PSYCHIATRIC: Mood, affect within normal limits.   ASSESSMENT AND PLAN  OSA Reviewed PSG results with patient. Starting on APAP therapy set to 4-16 cm H2O. Discussed the consequences of untreated sleep apnea. Advised not to drive drowsy for safety of patient and others. Will follow up in 3 months.  Obesity Counseled patient on diet and lifestyle modification.   Insomnia Counseled patient on stimulus control and improving sleep hygiene practices.    Patient  satisfied with Plan of action and management. All questions answered  I spent a total of 44 minutes  reviewing chart data, face-to-face evaluation with the patient, counseling and coordination of care as detailed above.    Benjamin Andrews, M.D.  Sleep Medicine [facility]           [1] No Known Allergies  "

## 2024-10-25 NOTE — Patient Instructions (Addendum)

## 2024-11-01 MED ORDER — TRAZODONE HCL 50 MG PO TABS: 25.0000 mg | ORAL_TABLET | Freq: Every day | ORAL | 0 refills | Status: AC

## 2024-11-08 ENCOUNTER — Ambulatory Visit (INDEPENDENT_AMBULATORY_CARE_PROVIDER_SITE_OTHER): Admitting: Nurse Practitioner

## 2024-11-08 ENCOUNTER — Encounter (INDEPENDENT_AMBULATORY_CARE_PROVIDER_SITE_OTHER): Payer: Self-pay | Admitting: Nurse Practitioner

## 2024-11-08 VITALS — BP 131/67 | HR 72 | Resp 17 | Ht 75.0 in | Wt 239.4 lb

## 2024-11-08 DIAGNOSIS — I8312 Varicose veins of left lower extremity with inflammation: Secondary | ICD-10-CM

## 2024-11-08 DIAGNOSIS — I8311 Varicose veins of right lower extremity with inflammation: Secondary | ICD-10-CM | POA: Diagnosis not present

## 2024-11-10 ENCOUNTER — Encounter (INDEPENDENT_AMBULATORY_CARE_PROVIDER_SITE_OTHER): Payer: Self-pay | Admitting: Nurse Practitioner

## 2024-11-10 NOTE — Progress Notes (Signed)
 Varicose veins of bilateral  lower extremity with inflammation (454.1  I83.10) Current Plans   Indication: Patient presents with symptomatic varicose veins of the bilateral  lower extremity.   Procedure: Sclerotherapy using hypertonic saline mixed with 1% Lidocaine was performed on the bilateral lower extremity. Compression wraps were placed. The patient tolerated the procedure well.

## 2024-11-20 ENCOUNTER — Ambulatory Visit
Admission: EM | Admit: 2024-11-20 | Discharge: 2024-11-20 | Disposition: A | Discharge status: Home / Self Care | Source: Home / Self Care

## 2024-11-20 ENCOUNTER — Encounter: Payer: Self-pay | Admitting: Emergency Medicine

## 2024-11-20 DIAGNOSIS — J101 Influenza due to other identified influenza virus with other respiratory manifestations: Secondary | ICD-10-CM

## 2024-11-20 LAB — POC COVID19/FLU A&B COMBO
Covid Antigen, POC: NEGATIVE
Influenza A Antigen, POC: POSITIVE — AB
Influenza B Antigen, POC: NEGATIVE

## 2024-11-20 MED ORDER — OSELTAMIVIR PHOSPHATE 75 MG PO CAPS: 75.0000 mg | ORAL_CAPSULE | Freq: Two times a day (BID) | ORAL | 0 refills | Status: AC

## 2024-11-20 NOTE — ED Notes (Signed)
 Patient triage by provider Teresa Shelba SAUNDERS, NP

## 2024-11-20 NOTE — Discharge Instructions (Addendum)
 Influenza A is a virus and should steadily improve in time it can take up to 7 to 10 days before you truly start to see a turnaround however things will get better  Begin Tamiflu  every morning and every evening for 5 days to reduce the amount of virus in the body which helps to minimize symptoms  Will need to quarantine until without fever for 24 hours.,  If no fever may continue activity wearing mask for 5 days from the start of your symptoms    You can take Tylenol and/or Ibuprofen as needed for fever reduction and pain relief.   For cough: honey 1/2 to 1 teaspoon (you can dilute the honey in water or another fluid).  You can also use guaifenesin and dextromethorphan for cough. You can use a humidifier for chest congestion and cough.  If you don't have a humidifier, you can sit in the bathroom with the hot shower running.      For sore throat: try warm salt water gargles, cepacol lozenges, throat spray, warm tea or water with lemon/honey, popsicles or ice, or OTC cold relief medicine for throat discomfort.   For congestion: take a daily anti-histamine like Zyrtec, Claritin, and a oral decongestant, such as pseudoephedrine.  You can also use Flonase 1-2 sprays in each nostril daily.   It is important to stay hydrated: drink plenty of fluids (water, gatorade/powerade/pedialyte, juices, or teas) to keep your throat moisturized and help further relieve irritation/discomfort.

## 2024-11-20 NOTE — ED Provider Notes (Signed)
 " CAY RALPH PELT    CSN: [id number] Arrival date & time: 11/20/24  1616      History   Chief Complaint No chief complaint on file.   HPI Dom Haverland is a 59 y.o. male.   Patient presents for evaluation of nasal congestion, postnasal drip, chest congestion, sinus drainage and a dry throat present for 3 days.  Has attempted use of Mucinex DM.  Tolerable to food and liquids.  No known sick contacts prior.  Denies fever cough shortness of breath or wheezing.       Past Medical History:  Diagnosis Date   Allergic rhinitis    Anginal pain    Blood in stool    Cervical radiculopathy at C6    Chickenpox    Generalized anxiety disorder    Headache    Hemorrhoids    High blood pressure    Left knee sprain    never was given PT but was given lidocaine  shots   Seizures (HCC)    diagnosed in 2000. 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   History of obstructive sleep apnea 01/26/2024   Ejaculatory disorder 01/20/2023   Varicose vein of scrotum 01/20/2023   Cervical radiculopathy 12/13/2019   Strain of calf muscle 05/25/2017   Seizures (HCC) 10/14/2016   Skin lesion of face 06/16/2016   Overweight 06/16/2016   Erectile dysfunction 03/16/2016   Elevated LDL cholesterol level 12/30/2015   Generalized anxiety disorder 09/30/2015   Encounter for general adult medical examination with abnormal findings 09/30/2015    Past Surgical History:  Procedure Laterality Date   ANTERIOR CERVICAL DECOMP/DISCECTOMY FUSION N/A 06/13/2022   Procedure: C5-6 ANTERIOR CERVICAL DISCECTOMY AND FUSION (GLOBUS HEDRON);  Surgeon: Clois Fret, MD;  Location: [facility] ORS;  Service: Neurosurgery;  Laterality: N/A;   COLONOSCOPY  WITH PROPOFOL  N/A 12/04/2015   Procedure: COLONOSCOPY WITH PROPOFOL ;  Surgeon: Lamar ONEIDA Holmes, MD;  Location: [facility] ENDOSCOPY;  Service: Endoscopy;  Laterality: N/A;   COLONOSCOPY WITH PROPOFOL  N/A 09/30/2022   Procedure: COLONOSCOPY WITH PROPOFOL ;  Surgeon: Maryruth Ole ONEIDA, MD;  Location: [facility] ENDOSCOPY;  Service: Endoscopy;  Laterality: N/A;   RECONSTRUCTION OF NOSE     fracture   SHOULDER SURGERY Right 2000   bicep repair       Home Medications    Prior to Admission medications  Medication Sig Start Date End Date Taking? Authorizing Provider  alfuzosin  (UROXATRAL ) 10 MG 24 hr tablet TAKE 1 TABLET BY MOUTH DAILY  WITH BREAKFAST 09/30/24   Stoioff, Glendia BROCKS, MD  Ascorbic Acid (VITAMIN C) 100 MG tablet Take 100 mg by mouth daily.    [provider]  BIOTIN PO Take 1 tablet by mouth daily.    [provider]  busPIRone  (BUSPAR ) 10 MG tablet Take 1 tablet (10 mg total) by mouth 2 (two) times daily. 01/26/24   Gretel App, NP  COPPER PO Take 1 tablet by mouth daily.    [provider]  dutasteride  (AVODART ) 0.5 MG capsule Take 1 capsule (0.5 mg total) by mouth daily. 01/26/24   Stoioff, Glendia BROCKS, MD  eszopiclone  (LUNESTA ) 1 MG TABS tablet Take 1 tablet (1 mg total) by mouth at bedtime as needed  for sleep. Take immediately before bedtime 05/03/24   Cobb, Comer GAILS, NP  MAGNESIUM PO Take 3 tablets by mouth daily.    [provider]  Misc Natural Products (JOINT HEALTH PO) Take 1 tablet by mouth daily.    [provider]  Multiple Vitamin (MULTIVITAMIN) tablet Take 1 tablet by mouth daily.    [provider]  Multiple Vitamins-Minerals (ZINC PO) Take 1 tablet by mouth daily.    [provider]  OMEGA-3 FATTY ACIDS PO Take 1 tablet by mouth daily.    [provider]  Oxcarbazepine  (TRILEPTAL ) 300 MG tablet Take 1 tablet (300 mg total) by mouth 2 (two) times daily. 10/03/24   Gayland Lauraine PARAS, NP  rosuvastatin  (CRESTOR ) 10  MG tablet Take 1 tablet (10 mg total) by mouth daily. 01/26/24   Gretel App, NP  Sodium Hyaluronate, oral, (HYALURONIC ACID PO) Take 1 tablet by mouth daily.    [provider]  tadalafil  (CIALIS ) 10 MG tablet Take 1 tablet (10 mg total) by mouth daily as needed for erectile dysfunction. 1-2 tabs 1 hr prior to intercourse 01/26/24   Twylla Glendia BROCKS, MD  tadalafil  (CIALIS ) 5 MG tablet Take 1 tablet (5 mg total) by mouth daily. 01/26/24   Stoioff, Glendia BROCKS, MD  traZODone  (DESYREL ) 50 MG tablet Take 0.5 tablets (25 mg total) by mouth at bedtime. Please cut the 50mg  tablet in half to get your 25mg  dose. 11/01/24   Reddy, Pallavi D, MD  VITAMIN D PO Take 1 tablet by mouth daily.    [provider]    Family History Family History  Problem Relation Age of Onset   Alcoholism Other        Grandparent   Arthritis Other        Grandparent   Stroke Other        Parent   Diabetes Other        Grandparent   Rectal cancer Maternal Uncle    Cancer Mother        unsure - started in back   Healthy Father     Social History Social History[1]   Allergies   Patient has no known allergies.   Review of Systems Review of Systems  Constitutional: Negative.   HENT:  Positive for congestion and postnasal drip. Negative for dental problem, drooling, ear discharge, ear pain, facial swelling, hearing loss, mouth sores, nosebleeds, rhinorrhea, sinus pressure, sinus pain, sneezing, sore throat, tinnitus, trouble swallowing and voice change.   Respiratory: Negative.    Gastrointestinal: Negative.      Physical Exam Triage Vital Signs ED Triage Vitals  Encounter Vitals Group     BP      Girls Systolic BP Percentile      Girls Diastolic BP Percentile      Boys Systolic BP Percentile      Boys Diastolic BP Percentile      Pulse      Resp      Temp      Temp src      SpO2      Weight      Height      Head Circumference      Peak Flow      Pain Score      Pain Loc      Pain  Education      Exclude from Growth Chart    No data found.  Updated Vital Signs There were no vitals taken for this  visit.  Visual Acuity Right Eye Distance:   Left Eye Distance:   Bilateral Distance:    Right Eye Near:   Left Eye Near:    Bilateral Near:     Physical Exam Constitutional:      Appearance: Normal appearance.  HENT:     Head: Normocephalic.     Right Ear: Tympanic membrane, ear canal and external ear normal.     Left Ear: Tympanic membrane, ear canal and external ear normal.     Nose: Congestion present.     Mouth/Throat:     Pharynx: Posterior oropharyngeal erythema present. No oropharyngeal exudate.  Cardiovascular:     Rate and Rhythm: Normal rate and regular rhythm.     Pulses: Normal pulses.     Heart sounds: Normal heart sounds.  Pulmonary:     Effort: Pulmonary effort is normal.     Breath sounds: Normal breath sounds.  Musculoskeletal:     Cervical back: Normal range of motion and neck supple.  Neurological:     Mental Status: He is alert and oriented to person, place, and time. Mental status is at baseline.      UC Treatments / Results  Labs (all labs ordered are listed, but only abnormal results are displayed) Labs Reviewed - No data to display  EKG   Radiology No results found.  Procedures Procedures (including critical care time)  Medications Ordered in UC Medications - No data to display  Initial Impression / Assessment and Plan / UC Course  I have reviewed the triage vital signs and the nursing notes.  Pertinent labs & imaging results that were available during my care of the patient were reviewed by me and considered in my medical decision making (see chart for details).  Influenza A, viral  Patient is in no signs of distress nor toxic appearing.  Vital signs are stable.  Low suspicion for pneumonia, pneumothorax or bronchitis and therefore will defer imaging.  Prescribed Tamiflu .  Discussed quarantining if with fever.  May use additional over-the-counter medications as needed for supportive care.  May follow-up with urgent care as needed if symptoms persist or worsen.  Final Clinical Impressions(s) / UC Diagnoses   Final diagnoses:  None   Discharge Instructions   None    ED Prescriptions   None    PDMP not reviewed this encounter.     [1]  Social History Tobacco Use   Smoking status: Never   Smokeless tobacco: Former    Types: Engineer, Drilling   Vaping status: Never Used  Substance Use Topics   Alcohol use: Yes    Comment: occassional (once a month)   Drug use: No     Teresa Shelba SAUNDERS, NP 11/20/24 1834  "

## 2024-12-06 ENCOUNTER — Ambulatory Visit (INDEPENDENT_AMBULATORY_CARE_PROVIDER_SITE_OTHER): Admitting: Nurse Practitioner

## 2025-01-03 ENCOUNTER — Ambulatory Visit (INDEPENDENT_AMBULATORY_CARE_PROVIDER_SITE_OTHER): Admitting: Nurse Practitioner

## 2025-01-24 ENCOUNTER — Ambulatory Visit: Admitting: Urology

## 2025-01-24 ENCOUNTER — Ambulatory Visit: Admitting: Sleep Medicine

## 2025-01-31 ENCOUNTER — Encounter: Admitting: Nurse Practitioner

## 2025-02-03 ENCOUNTER — Ambulatory Visit: Admitting: Sleep Medicine

## 2025-10-02 ENCOUNTER — Ambulatory Visit: Admitting: Neurology
